# Patient Record
Sex: Female | Born: 1975 | Race: White | Hispanic: Refuse to answer | Marital: Single
Health system: Southern US, Academic
[De-identification: ages and names within clinical notes are randomized; demographics above are authoritative.]

## PROBLEM LIST (undated history)

## (undated) ENCOUNTER — Ambulatory Visit

## (undated) ENCOUNTER — Encounter

## (undated) DIAGNOSIS — N83202 Unspecified ovarian cyst, left side: Secondary | ICD-10-CM

## (undated) DIAGNOSIS — R351 Nocturia: Secondary | ICD-10-CM

## (undated) DIAGNOSIS — J302 Other seasonal allergic rhinitis: Secondary | ICD-10-CM

## (undated) DIAGNOSIS — R350 Frequency of micturition: Secondary | ICD-10-CM

## (undated) DIAGNOSIS — Z8709 Personal history of other diseases of the respiratory system: Secondary | ICD-10-CM

## (undated) DIAGNOSIS — N809 Endometriosis, unspecified: Secondary | ICD-10-CM

## (undated) DIAGNOSIS — K219 Gastro-esophageal reflux disease without esophagitis: Secondary | ICD-10-CM

## (undated) DIAGNOSIS — K589 Irritable bowel syndrome without diarrhea: Secondary | ICD-10-CM

## (undated) DIAGNOSIS — E559 Vitamin D deficiency, unspecified: Secondary | ICD-10-CM

## (undated) DIAGNOSIS — M62838 Other muscle spasm: Secondary | ICD-10-CM

## (undated) DIAGNOSIS — N83201 Unspecified ovarian cyst, right side: Secondary | ICD-10-CM

## (undated) DIAGNOSIS — G4700 Insomnia, unspecified: Secondary | ICD-10-CM

## (undated) DIAGNOSIS — G8929 Other chronic pain: Secondary | ICD-10-CM

## (undated) DIAGNOSIS — J45909 Unspecified asthma, uncomplicated: Secondary | ICD-10-CM

## (undated) DIAGNOSIS — I059 Rheumatic mitral valve disease, unspecified: Secondary | ICD-10-CM

## (undated) DIAGNOSIS — R011 Cardiac murmur, unspecified: Secondary | ICD-10-CM

## (undated) DIAGNOSIS — G43909 Migraine, unspecified, not intractable, without status migrainosus: Secondary | ICD-10-CM

## (undated) DIAGNOSIS — E785 Hyperlipidemia, unspecified: Secondary | ICD-10-CM

## (undated) DIAGNOSIS — E039 Hypothyroidism, unspecified: Secondary | ICD-10-CM

## (undated) DIAGNOSIS — N200 Calculus of kidney: Secondary | ICD-10-CM

## (undated) DIAGNOSIS — I639 Cerebral infarction, unspecified: Secondary | ICD-10-CM

## (undated) DIAGNOSIS — N189 Chronic kidney disease, unspecified: Secondary | ICD-10-CM

## (undated) DIAGNOSIS — F419 Anxiety disorder, unspecified: Secondary | ICD-10-CM

## (undated) DIAGNOSIS — M549 Dorsalgia, unspecified: Secondary | ICD-10-CM

## (undated) DIAGNOSIS — F32A Depression, unspecified: Secondary | ICD-10-CM

## (undated) DIAGNOSIS — F329 Major depressive disorder, single episode, unspecified: Secondary | ICD-10-CM

## (undated) HISTORY — DX: Major depressive disorder, single episode, unspecified: F32.9

## (undated) HISTORY — DX: Endometriosis, unspecified: N80.9

## (undated) HISTORY — PX: COLONOSCOPY: SHX174

## (undated) HISTORY — DX: Anxiety disorder, unspecified: F41.9

## (undated) HISTORY — DX: Chronic kidney disease, unspecified: N18.9

## (undated) HISTORY — DX: Hyperlipidemia, unspecified: E78.5

## (undated) HISTORY — DX: Depression, unspecified: F32.A

## (undated) HISTORY — DX: Migraine, unspecified, not intractable, without status migrainosus: G43.909

## (undated) HISTORY — DX: Cardiac murmur, unspecified: R01.1

## (undated) HISTORY — DX: Calculus of kidney: N20.0

## (undated) HISTORY — DX: Cerebral infarction, unspecified: I63.9

## (undated) HISTORY — DX: Rheumatic mitral valve disease, unspecified: I05.9

## (undated) HISTORY — PX: OTHER SURGICAL HISTORY: SHX169

## (undated) HISTORY — DX: Unspecified ovarian cyst, right side: N83.201

## (undated) HISTORY — PX: LUMBAR LAMINECTOMY: SHX95

## (undated) HISTORY — DX: Vitamin D deficiency, unspecified: E55.9

## (undated) HISTORY — DX: Unspecified ovarian cyst, left side: N83.202

---

## 2005-04-05 HISTORY — PX: LASIK: SHX215

## 2007-04-06 DIAGNOSIS — I639 Cerebral infarction, unspecified: Secondary | ICD-10-CM

## 2007-04-06 HISTORY — DX: Cerebral infarction, unspecified: I63.9

## 2013-09-26 ENCOUNTER — Institutional Professional Consult (permissible substitution): Payer: Self-pay | Admitting: Neurology

## 2013-10-02 ENCOUNTER — Ambulatory Visit (INDEPENDENT_AMBULATORY_CARE_PROVIDER_SITE_OTHER): Payer: PRIVATE HEALTH INSURANCE | Admitting: Neurology

## 2013-10-02 ENCOUNTER — Encounter: Payer: Self-pay | Admitting: Neurology

## 2013-10-02 ENCOUNTER — Encounter (INDEPENDENT_AMBULATORY_CARE_PROVIDER_SITE_OTHER): Payer: Self-pay

## 2013-10-02 VITALS — BP 112/80 | HR 71 | Temp 98.0°F | Ht 63.0 in | Wt 139.0 lb

## 2013-10-02 DIAGNOSIS — G479 Sleep disorder, unspecified: Secondary | ICD-10-CM | POA: Insufficient documentation

## 2013-10-02 DIAGNOSIS — R400 Somnolence: Secondary | ICD-10-CM

## 2013-10-02 DIAGNOSIS — R404 Transient alteration of awareness: Secondary | ICD-10-CM

## 2013-10-02 DIAGNOSIS — I6350 Cerebral infarction due to unspecified occlusion or stenosis of unspecified cerebral artery: Secondary | ICD-10-CM

## 2013-10-02 DIAGNOSIS — I639 Cerebral infarction, unspecified: Secondary | ICD-10-CM

## 2013-10-02 NOTE — Progress Notes (Signed)
Subjective:    Patient ID: Brenda Bonilla is a 38 y.o. female.  HPI    Huston FoleySaima Athar, MD, PhD [facility] [address], [address] [address] [place], [place] [address]  Dear Dr. Shary DecampGrisso and Efraim KaufmannMelissa,   I saw your patient, Brenda Bonilla, upon your kind request in my neurologic clinic today for initial consultation of her sleep disorder, in particular her daytime somnolence, concern for underlying sleep disordered breathing. The patient is unaccompanied today. As you know, Brenda Bonilla is a 38 year old right-handed woman with an underlying medical history of thyroid disease, asthma, hyperlipidemia, as well as stroke in 2009 (per patient, cerebellar, positive MRI, [facility], was taken off Relpax and OCP, saw neuro and cardio, now on ASA 325 mg), Irritable bowel syndrome, migraine headaches, reflux disease, and depression, who reports significant daytime somnolence. His ESS is 16/24 today. This has been going on for years and has been progressive. She has trouble "shutting down". She has been on Topamax since 2009. She snores, but only softly, no apneas reported, no gasping. She does have occasional AM HAs and has nocturia 1-2 x/night.  She drinks 1 cup of coffee in the AM, no sodas. She does not smoke and drinks EtOH occasionally, no illicit drugs. She lives alone. There is a TV in the bedroom and she puts it on a time. She does not have a sleep routine. She works as a Event organiserathletic trainer. She has no set bedtime, but it could be as early as 7:30 PM to 2 AM. Her rise time is between 6 AM to 10 AM. She feels poorly rested in the AM and has trouble going to sleep and more so staying asleep.  Her father is suspected to have OSA.  She naps when she has time and tends to sleep better during a nap. There is report of nighttime reflux, with no nighttime cough experienced. The patient has not noted any RLS symptoms and is not known to kick while asleep or before falling asleep.   She is a restless sleeper and in the morning, the bed is quite disheveled.   She denies cataplexy, sleep paralysis, hypnagogic or hypnopompic hallucinations, or sleep attacks. She does not report any vivid dreams, nightmares, dream enactments, or parasomnias, such as sleep talking or sleep walking. The patient has not had a sleep study or a home sleep test. She has no pets as she is allergic.   Her Past Medical History Is Significant For: Past Medical History  Diagnosis Date  . Stroke 2009  . Murmur, cardiac   . Mitral valve disorder   . Anxiety   . Depression   . Migraine   . Hyperlipidemia     Her Past Surgical History Is Significant For: Past Surgical History  Procedure Laterality Date  . Lasik  2007    Her Family History Is Significant For: Family History  Problem Relation Age of Onset  . Heart failure Father   . Stroke Father   . Stroke Paternal Grandmother   . Stroke Maternal Grandmother   . Hypertension Father   . Hyperlipidemia Father   . Hyperlipidemia Mother   . Diabetes Father   . Heart disease Mother     Her Social History Is Significant For: History   Social History  . Marital Status: Unknown    Spouse Name: N/A    Number of Children: N/A  . Years of Education: N/A   Social History Main Topics  . Smoking status:  Never Smoker   . Smokeless tobacco: None  . Alcohol Use: 2.5 oz/week    5 drink(s) per week  . Drug Use: No  . Sexual Activity: None   Other Topics Concern  . None   Social History Narrative  . None    Her Allergies Are:  No Known Allergies:   Her Current Medications Are:  Outpatient Encounter Prescriptions as of 10/02/2013  Medication Sig  . aspirin 325 MG tablet Take 325 mg by mouth daily.  Marland Kitchen. aspirin-acetaminophen-caffeine (EXCEDRIN MIGRAINE) 250-250-65 MG per tablet Take 1 tablet by mouth every 6 (six) hours as needed for headache.  . dicyclomine (BENTYL) 20 MG tablet Take 20 mg by mouth every 6 (six) hours.  Marland Kitchen. FLUoxetine  (PROZAC) 20 MG capsule Take 20 mg by mouth daily.  Marland Kitchen. levothyroxine (SYNTHROID, LEVOTHROID) 75 MCG tablet Take 75 mcg by mouth daily before breakfast.  . loratadine (CLARITIN) 10 MG tablet Take 10 mg by mouth daily.  . montelukast (SINGULAIR) 10 MG tablet Take 10 mg by mouth at bedtime.  Marland Kitchen. omeprazole (PRILOSEC) 20 MG capsule Take 20 mg by mouth daily.  . pravastatin (PRAVACHOL) 10 MG tablet Take 10 mg by mouth daily.  Marland Kitchen. topiramate (TOPAMAX) 100 MG tablet Take 100 mg by mouth 2 (two) times daily.  :  Review of Systems:  Out of a complete 14 point review of systems, all are reviewed and negative with the exception of these symptoms as listed below:   Review of Systems  Constitutional: Positive for activity change and fatigue.  Eyes: Negative.   Respiratory: Negative.   Cardiovascular:       Murmur  Gastrointestinal: Positive for diarrhea and blood in stool.  Endocrine: Positive for polydipsia.  Genitourinary: Positive for difficulty urinating.  Musculoskeletal: Negative.   Skin: Negative.   Allergic/Immunologic: Positive for environmental allergies.  Neurological: Positive for headaches.  Hematological: Negative.   Psychiatric/Behavioral: Positive for sleep disturbance (insomnia, eds) and dysphoric mood. The patient is nervous/anxious.     Objective:  Neurologic Exam  Physical Exam Physical Examination:   Filed Vitals:   10/02/13 0903  BP: 112/80  Pulse: 71  Temp: 98 F (36.7 C)    General Examination: The patient is a very pleasant 38 y.o. female in no acute distress. She appears well-developed and well-nourished and adequately groomed.   HEENT: Normocephalic, atraumatic, pupils are equal, round and reactive to light and accommodation. Funduscopic exam is normal with sharp disc margins noted. Extraocular tracking is good without limitation to gaze excursion or nystagmus noted. Normal smooth pursuit is noted. Hearing is grossly intact. Tympanic membranes are clear  bilaterally. Face is symmetric with normal facial animation and normal facial sensation. Speech is clear with no dysarthria noted. There is no hypophonia. There is no lip, neck/head, jaw or voice tremor. Neck is supple with full range of passive and active motion. There are no carotid bruits on auscultation. Oropharynx exam reveals: mild mouth dryness, good dental hygiene and moderate airway crowding, due to narrow airway entry and tonsils and slightly bigger tongue. Mallampati is class II. Tongue protrudes centrally and palate elevates symmetrically. Tonsils are 1+ to 2+ in size. Neck size is 13.5 inches. She has a tiny overbite. Nasal inspection reveals no significant nasal mucosal bogginess or redness and no septal deviation.   Chest: Clear to auscultation without wheezing, rhonchi or crackles noted.  Heart: S1+S2+0, regular and normal without murmurs, rubs or gallops noted.   Abdomen: Soft, non-tender and non-distended with normal bowel sounds  appreciated on auscultation.  Extremities: There is no pitting edema in the distal lower extremities bilaterally. Pedal pulses are intact.  Skin: Warm and dry without trophic changes noted. There are no varicose veins.  Musculoskeletal: exam reveals no obvious joint deformities, tenderness or joint swelling or erythema.   Neurologically:  Mental status: The patient is awake, alert and oriented in all 4 spheres. Her immediate and remote memory, attention, language skills and fund of knowledge are appropriate. There is no evidence of aphasia, agnosia, apraxia or anomia. Speech is clear with normal prosody and enunciation. Thought process is linear. Mood is normal and affect is normal.  Cranial nerves II - XII are as described above under HEENT exam. In addition: shoulder shrug is normal with equal shoulder height noted. Motor exam: Normal bulk, strength and tone is noted. There is no drift, tremor or rebound. Romberg is negative. Reflexes are 2+ throughout.  Babinski: Toes are flexor bilaterally. Fine motor skills and coordination: intact with normal finger taps, normal hand movements, normal rapid alternating patting, normal foot taps and normal foot agility.  Cerebellar testing: No dysmetria or intention tremor on finger to nose testing. Heel to shin is unremarkable bilaterally. There is no truncal or gait ataxia.  Sensory exam: intact to light touch, pinprick, vibration, temperature sense in the upper and lower extremities.  Gait, station and balance: She stands easily. No veering to one side is noted. No leaning to one side is noted. Posture is age-appropriate and stance is narrow based. Gait shows normal stride length and normal pace. No problems turning are noted. She turns en bloc. Tandem walk is unremarkable. Intact toe and heel stance is noted.               Assessment and Plan:   In summary, Brenda Bonilla is a very pleasant 38 y.o.-year old female with an underlying medical history of thyroid disease, asthma, hyperlipidemia, as well as cerebellar stroke (no residual signs noticible) in 2009, IBS, migraine headaches, reflux disease, and depression, who reports significant daytime somnolence with an ESS is 16/24 today. Her history and physical exam are not highly suspicious for sleep apnea as an underlying culprit of her daytime somnolence. She also does not have telltale symptoms of narcolepsy. Nevertheless, she may have a significant sleepiness disorder. She is on sedating medications but this may be a combination of medication effect, poor sleep hygiene, underlying anxiety/depression or mood disorder. She is trying to taper off of Prozac as I understand. She was on 40 mg and is currently on 20 mg. I am not sure as to the etiology of her sleepiness. I think it's worthwhile proceeding with further sleep evaluation in the form of a nocturnal polysomnogram followed by a nap study. In preparation of these studies she will have to come off of Prozac. She  has already discussed with you trying to taper off of it. She can go to once every other day for several days and then stop it. She is going to coordinate this with you. I talked to her at length about improving her sleep hygiene. Her physical exam is nonfocal and I reassured her in that regard. We talked about trying to maintain a healthy lifestyle in general, as well as the importance of weight control. I encouraged the patient to eat healthy, exercise daily and keep well hydrated, to keep a scheduled bedtime and wake time routine, to not skip any meals and eat healthy snacks in between meals. I advised the patient not to  drive when feeling sleepy. I recommended the following at this time: sleep study followed by MSLT. She is advised that she should be off of Prozac for 10-14 days prior to sleep testing.  I explained the sleep test procedure to the patient. We did talk about sleep apnea and its treatments and how it's linked to cardiovascular disease including stroke. Again, I think pretest probability for OSA is rather low in her case. She does seem have a positive family history for OSA and her father. She also has a mild to moderately tight airway. I answered all her questions today and the patient was in agreement. I would like to see her back after the sleep study is completed and encouraged her to call with any interim questions, concerns, problems or updates.   Thank you very much for allowing me to participate in the care of this nice patient. If I can be of any further assistance to you please do not hesitate to call me at [phone].  Sincerely,   Huston Foley, MD, PhD

## 2013-10-02 NOTE — Patient Instructions (Addendum)
  We will do a sleep study overnight and a subsequent nap study to further evaluate your severe sleepiness. However, in order to do these tests correctly, you will have to come off of the Prozac gradually. [facility] reducing your Prozac to 1/2 pill daily for some time and then stop. You should be off of the prozac 10-14 days prior to testing. We will schedule you accordingly. You can continue with all other meds.   Don't drive when sleepy.   Please remember to try to maintain good sleep hygiene, which means: Keep a regular sleep and wake schedule, try not to exercise or have a meal within 2 hours of your bedtime, try to keep your bedroom conducive for sleep, that is, cool and dark, without light distractors such as an illuminated alarm clock, and refrain from watching TV right before sleep or in the middle of the night and do not keep the TV or radio on during the night. Also, try not to use or play on electronic devices at bedtime, such as your cell phone, tablet PC or laptop. If you like to read at bedtime on an electronic device, try to dim the background light as much as possible. Do not eat in the middle of the night.

## 2013-10-08 ENCOUNTER — Telehealth: Payer: Self-pay | Admitting: Neurology

## 2013-10-08 NOTE — Telephone Encounter (Signed)
Please advise patient that she was referred for significant daytime somnolence for sleep evaluation and recommendation from my end of things was to proceed with sleep study testing. I do think that this is the next step in evaluation.

## 2013-10-08 NOTE — Telephone Encounter (Signed)
Pt calls requesting a call back from Dr. Frances FurbishAthar to discuss alternative treatment options regarding test.  Pt's estimated oop for the sleep study is $1650.00 due to her unmet deductible.  She would like medical advisement to see if there is another or alternative form of testing.  Advised the patient that notification of her request would be sent to the physician.

## 2013-11-25 ENCOUNTER — Ambulatory Visit (INDEPENDENT_AMBULATORY_CARE_PROVIDER_SITE_OTHER): Payer: PRIVATE HEALTH INSURANCE | Admitting: Neurology

## 2013-11-25 ENCOUNTER — Encounter: Payer: Self-pay | Admitting: Neurology

## 2013-11-25 DIAGNOSIS — G479 Sleep disorder, unspecified: Secondary | ICD-10-CM

## 2013-11-25 DIAGNOSIS — R0609 Other forms of dyspnea: Secondary | ICD-10-CM

## 2013-11-25 DIAGNOSIS — G4710 Hypersomnia, unspecified: Secondary | ICD-10-CM

## 2013-11-25 DIAGNOSIS — I639 Cerebral infarction, unspecified: Secondary | ICD-10-CM

## 2013-11-25 DIAGNOSIS — R0989 Other specified symptoms and signs involving the circulatory and respiratory systems: Secondary | ICD-10-CM

## 2013-11-25 DIAGNOSIS — R400 Somnolence: Secondary | ICD-10-CM

## 2013-11-26 DIAGNOSIS — G4710 Hypersomnia, unspecified: Secondary | ICD-10-CM

## 2013-12-07 ENCOUNTER — Telehealth: Payer: Self-pay | Admitting: Neurology

## 2013-12-07 NOTE — Telephone Encounter (Signed)
Please call the patient and advise her that her recent sleep studies did not show any significant obstructive sleep apnea but confirmed the fact that she is very sleepy during the day.Her daytime nap study showed findings consistent with an excessive degree of sleepiness but not telltale findings for narcolepsy. I would like to go for details of her study during a followup appointment. Please arrange for an appointment.

## 2013-12-11 NOTE — Telephone Encounter (Signed)
Per the patient's permission a general message was left on the patient's voicemail indicating that there was no indicator for the presence of sleep apnea.  It was also explained that there was no diagnosis of narcolepsy following the MSLT.  Patient was instructed to call office to set up follow up appointment.

## 2014-12-03 DIAGNOSIS — J4520 Mild intermittent asthma, uncomplicated: Secondary | ICD-10-CM | POA: Insufficient documentation

## 2014-12-03 DIAGNOSIS — J309 Allergic rhinitis, unspecified: Principal | ICD-10-CM

## 2014-12-03 DIAGNOSIS — H1010 Acute atopic conjunctivitis, unspecified eye: Secondary | ICD-10-CM | POA: Insufficient documentation

## 2014-12-03 DIAGNOSIS — Z8669 Personal history of other diseases of the nervous system and sense organs: Secondary | ICD-10-CM | POA: Insufficient documentation

## 2015-01-10 ENCOUNTER — Ambulatory Visit (INDEPENDENT_AMBULATORY_CARE_PROVIDER_SITE_OTHER): Payer: PRIVATE HEALTH INSURANCE | Admitting: *Deleted

## 2015-01-10 DIAGNOSIS — J309 Allergic rhinitis, unspecified: Secondary | ICD-10-CM | POA: Diagnosis not present

## 2015-01-24 ENCOUNTER — Ambulatory Visit (INDEPENDENT_AMBULATORY_CARE_PROVIDER_SITE_OTHER): Payer: PRIVATE HEALTH INSURANCE

## 2015-01-24 DIAGNOSIS — J309 Allergic rhinitis, unspecified: Secondary | ICD-10-CM | POA: Diagnosis not present

## 2015-02-07 ENCOUNTER — Ambulatory Visit (INDEPENDENT_AMBULATORY_CARE_PROVIDER_SITE_OTHER): Payer: PRIVATE HEALTH INSURANCE | Admitting: *Deleted

## 2015-02-07 DIAGNOSIS — J309 Allergic rhinitis, unspecified: Secondary | ICD-10-CM

## 2015-02-21 ENCOUNTER — Ambulatory Visit (INDEPENDENT_AMBULATORY_CARE_PROVIDER_SITE_OTHER): Payer: PRIVATE HEALTH INSURANCE | Admitting: *Deleted

## 2015-02-21 DIAGNOSIS — J309 Allergic rhinitis, unspecified: Secondary | ICD-10-CM

## 2015-03-10 ENCOUNTER — Ambulatory Visit (INDEPENDENT_AMBULATORY_CARE_PROVIDER_SITE_OTHER): Payer: PRIVATE HEALTH INSURANCE | Admitting: *Deleted

## 2015-03-10 DIAGNOSIS — J309 Allergic rhinitis, unspecified: Secondary | ICD-10-CM

## 2015-03-27 ENCOUNTER — Ambulatory Visit (INDEPENDENT_AMBULATORY_CARE_PROVIDER_SITE_OTHER): Payer: PRIVATE HEALTH INSURANCE | Admitting: *Deleted

## 2015-03-27 DIAGNOSIS — J309 Allergic rhinitis, unspecified: Secondary | ICD-10-CM | POA: Diagnosis not present

## 2015-04-11 ENCOUNTER — Ambulatory Visit (INDEPENDENT_AMBULATORY_CARE_PROVIDER_SITE_OTHER): Payer: PRIVATE HEALTH INSURANCE | Admitting: *Deleted

## 2015-04-11 DIAGNOSIS — J309 Allergic rhinitis, unspecified: Secondary | ICD-10-CM | POA: Diagnosis not present

## 2015-04-18 ENCOUNTER — Ambulatory Visit (INDEPENDENT_AMBULATORY_CARE_PROVIDER_SITE_OTHER): Payer: PRIVATE HEALTH INSURANCE | Admitting: *Deleted

## 2015-04-18 DIAGNOSIS — J309 Allergic rhinitis, unspecified: Secondary | ICD-10-CM

## 2015-04-25 ENCOUNTER — Ambulatory Visit (INDEPENDENT_AMBULATORY_CARE_PROVIDER_SITE_OTHER): Payer: PRIVATE HEALTH INSURANCE | Admitting: *Deleted

## 2015-04-25 DIAGNOSIS — J309 Allergic rhinitis, unspecified: Secondary | ICD-10-CM

## 2015-05-01 ENCOUNTER — Ambulatory Visit (INDEPENDENT_AMBULATORY_CARE_PROVIDER_SITE_OTHER): Payer: PRIVATE HEALTH INSURANCE | Admitting: *Deleted

## 2015-05-01 DIAGNOSIS — J309 Allergic rhinitis, unspecified: Secondary | ICD-10-CM

## 2015-05-15 ENCOUNTER — Ambulatory Visit (INDEPENDENT_AMBULATORY_CARE_PROVIDER_SITE_OTHER): Payer: PRIVATE HEALTH INSURANCE | Admitting: *Deleted

## 2015-05-15 DIAGNOSIS — J309 Allergic rhinitis, unspecified: Secondary | ICD-10-CM

## 2015-05-23 ENCOUNTER — Ambulatory Visit (INDEPENDENT_AMBULATORY_CARE_PROVIDER_SITE_OTHER): Payer: PRIVATE HEALTH INSURANCE

## 2015-05-23 DIAGNOSIS — J309 Allergic rhinitis, unspecified: Secondary | ICD-10-CM | POA: Diagnosis not present

## 2015-06-05 ENCOUNTER — Ambulatory Visit (INDEPENDENT_AMBULATORY_CARE_PROVIDER_SITE_OTHER): Payer: PRIVATE HEALTH INSURANCE | Admitting: *Deleted

## 2015-06-05 DIAGNOSIS — J309 Allergic rhinitis, unspecified: Secondary | ICD-10-CM

## 2015-06-20 ENCOUNTER — Ambulatory Visit (INDEPENDENT_AMBULATORY_CARE_PROVIDER_SITE_OTHER): Payer: PRIVATE HEALTH INSURANCE | Admitting: *Deleted

## 2015-06-20 DIAGNOSIS — J309 Allergic rhinitis, unspecified: Secondary | ICD-10-CM | POA: Diagnosis not present

## 2015-07-07 ENCOUNTER — Ambulatory Visit (INDEPENDENT_AMBULATORY_CARE_PROVIDER_SITE_OTHER): Payer: PRIVATE HEALTH INSURANCE | Admitting: *Deleted

## 2015-07-07 DIAGNOSIS — J309 Allergic rhinitis, unspecified: Secondary | ICD-10-CM

## 2015-07-17 ENCOUNTER — Ambulatory Visit (INDEPENDENT_AMBULATORY_CARE_PROVIDER_SITE_OTHER): Payer: PRIVATE HEALTH INSURANCE | Admitting: *Deleted

## 2015-07-17 DIAGNOSIS — J309 Allergic rhinitis, unspecified: Secondary | ICD-10-CM

## 2015-07-21 DIAGNOSIS — J301 Allergic rhinitis due to pollen: Secondary | ICD-10-CM | POA: Diagnosis not present

## 2015-07-22 DIAGNOSIS — J3089 Other allergic rhinitis: Secondary | ICD-10-CM | POA: Diagnosis not present

## 2015-07-25 ENCOUNTER — Ambulatory Visit (INDEPENDENT_AMBULATORY_CARE_PROVIDER_SITE_OTHER): Payer: PRIVATE HEALTH INSURANCE | Admitting: *Deleted

## 2015-07-25 DIAGNOSIS — J309 Allergic rhinitis, unspecified: Secondary | ICD-10-CM

## 2015-08-04 ENCOUNTER — Ambulatory Visit (INDEPENDENT_AMBULATORY_CARE_PROVIDER_SITE_OTHER): Payer: PRIVATE HEALTH INSURANCE

## 2015-08-04 DIAGNOSIS — J309 Allergic rhinitis, unspecified: Secondary | ICD-10-CM | POA: Diagnosis not present

## 2015-08-05 ENCOUNTER — Other Ambulatory Visit: Payer: Self-pay | Admitting: Orthopedic Surgery

## 2015-08-12 NOTE — Pre-Procedure Instructions (Signed)
Brenda SkatesGarrie Renken  08/12/2015      [id number] [place], [place] - [address] [place] [address] Phone: [phone] Fax: [phone]    Your procedure is scheduled on Wed, May 17 @ 12:50 PM  [facility] at 9:45 AM  Call this number if you have problems the morning of surgery:  [phone]   Remember:  Do not eat food or drink liquids after midnight.  Take these medicines the morning of surgery with A SIP OF WATER Albuterol<Bring Your Inhaler With You>,Zyrtec(Cetirizine),Bentyl(Dicyclomine),Synthroid(Levothyroxine),Eye Drops,Singulair(Montelukast),and Omeprazole(Prilosec)              Stop taking your Aspirin. No Goody's,BC's,Aleve,Advil,Motrin,Ibuprofen,Fish Oil,or any Herbal Medications.    Do not wear jewelry, make-up or nail polish.  Do not wear lotions, powders, or perfumes.   Do not shave 48 hours prior to surgery.    Do not bring valuables to the hospital.  [facility] is not responsible for any belongings or valuables.  Contacts, dentures or bridgework may not be worn into surgery.  Leave your suitcase in the car.  After surgery it may be brought to your room.  For patients admitted to the hospital, discharge time will be determined by your treatment team.  Patients discharged the day of surgery will not be allowed to drive home.    Special instructions:  [place] - Preparing for Surgery  Before surgery, you can play an important role.  Because skin is not sterile, your skin needs to be as free of germs as possible.  You can reduce the number of germs on you skin by washing with CHG (chlorahexidine gluconate) soap before surgery.  CHG is an antiseptic cleaner which kills germs and bonds with the skin to continue killing germs even after washing.  Please DO NOT use if you have an allergy to CHG or antibacterial soaps.  If your skin becomes reddened/irritated stop using the CHG and inform your nurse  when you arrive at Short Stay.  Do not shave (including legs and underarms) for at least 48 hours prior to the first CHG shower.  You may shave your face.  Please follow these instructions carefully:   1.  Shower with CHG Soap the night before surgery and the                                morning of Surgery.  2.  If you choose to wash your hair, wash your hair first as usual with your       normal shampoo.  3.  After you shampoo, rinse your hair and body thoroughly to remove the                      Shampoo.  4.  Use CHG as you would any other liquid soap.  You can apply chg directly       to the skin and wash gently with scrungie or a clean washcloth.  5.  Apply the CHG Soap to your body ONLY FROM THE NECK DOWN.        Do not use on open wounds or open sores.  Avoid contact with your eyes,       ears, mouth and genitals (private parts).  Wash genitals (private parts)       with your normal soap.  6.  Wash thoroughly, paying special attention  to the area where your surgery        will be performed.  7.  Thoroughly rinse your body with warm water from the neck down.  8.  DO NOT shower/wash with your normal soap after using and rinsing off       the CHG Soap.  9.  Pat yourself dry with a clean towel.            10.  Wear clean pajamas.            11.  Place clean sheets on your bed the night of your first shower and do not        sleep with pets.  Day of Surgery  Do not apply any lotions/deoderants the morning of surgery.  Please wear clean clothes to the hospital/surgery center.    Please read over the following fact sheets that you were given. Pain Booklet, Coughing and Deep Breathing, Blood Transfusion Information, MRSA Information and Surgical Site Infection Prevention

## 2015-08-13 ENCOUNTER — Encounter (HOSPITAL_COMMUNITY)
Admission: RE | Admit: 2015-08-13 | Discharge: 2015-08-13 | Disposition: A | Discharge status: Home / Self Care | Payer: PRIVATE HEALTH INSURANCE | Source: Ambulatory Visit | Attending: Orthopedic Surgery | Admitting: Orthopedic Surgery

## 2015-08-13 ENCOUNTER — Encounter (HOSPITAL_COMMUNITY): Payer: Self-pay

## 2015-08-13 DIAGNOSIS — Z0183 Encounter for blood typing: Secondary | ICD-10-CM | POA: Diagnosis not present

## 2015-08-13 DIAGNOSIS — Z01818 Encounter for other preprocedural examination: Secondary | ICD-10-CM | POA: Diagnosis not present

## 2015-08-13 DIAGNOSIS — M5410 Radiculopathy, site unspecified: Secondary | ICD-10-CM | POA: Insufficient documentation

## 2015-08-13 DIAGNOSIS — R9431 Abnormal electrocardiogram [ECG] [EKG]: Secondary | ICD-10-CM | POA: Diagnosis not present

## 2015-08-13 DIAGNOSIS — Z01812 Encounter for preprocedural laboratory examination: Secondary | ICD-10-CM | POA: Insufficient documentation

## 2015-08-13 HISTORY — DX: Unspecified asthma, uncomplicated: J45.909

## 2015-08-13 HISTORY — DX: Irritable bowel syndrome, unspecified: K58.9

## 2015-08-13 HISTORY — DX: Other seasonal allergic rhinitis: J30.2

## 2015-08-13 HISTORY — DX: Other muscle spasm: M62.838

## 2015-08-13 HISTORY — DX: Hypothyroidism, unspecified: E03.9

## 2015-08-13 HISTORY — DX: Gastro-esophageal reflux disease without esophagitis: K21.9

## 2015-08-13 HISTORY — DX: Insomnia, unspecified: G47.00

## 2015-08-13 HISTORY — DX: Nocturia: R35.1

## 2015-08-13 HISTORY — DX: Other chronic pain: G89.29

## 2015-08-13 HISTORY — DX: Frequency of micturition: R35.0

## 2015-08-13 HISTORY — DX: Personal history of other diseases of the respiratory system: Z87.09

## 2015-08-13 HISTORY — DX: Dorsalgia, unspecified: M54.9

## 2015-08-13 LAB — COMPREHENSIVE METABOLIC PANEL
ALBUMIN: 4.3 g/dL (ref 3.5–5.0)
ALK PHOS: 48 U/L (ref 38–126)
ALT: 18 U/L (ref 14–54)
ANION GAP: 9 (ref 5–15)
AST: 19 U/L (ref 15–41)
BILIRUBIN TOTAL: 0.6 mg/dL (ref 0.3–1.2)
BUN: 21 mg/dL — ABNORMAL HIGH (ref 6–20)
CALCIUM: 9.6 mg/dL (ref 8.9–10.3)
CO2: 20 mmol/L — AB (ref 22–32)
CREATININE: 0.86 mg/dL (ref 0.44–1.00)
Chloride: 110 mmol/L (ref 101–111)
GFR calc non Af Amer: >60 mL/min (ref >60)
GLUCOSE: 101 mg/dL — AB (ref 65–99)
Potassium: 3.5 mmol/L (ref 3.5–5.1)
SODIUM: 139 mmol/L (ref 135–145)
TOTAL PROTEIN: 7.7 g/dL (ref 6.5–8.1)

## 2015-08-13 LAB — URINALYSIS, ROUTINE W REFLEX MICROSCOPIC
Bilirubin Urine: NEGATIVE
Glucose, UA: NEGATIVE mg/dL
Hgb urine dipstick: NEGATIVE
Ketones, ur: NEGATIVE mg/dL
Nitrite: NEGATIVE
PROTEIN: NEGATIVE mg/dL
SPECIFIC GRAVITY, URINE: 1.023 (ref 1.005–1.030)
pH: 5.5 (ref 5.0–8.0)

## 2015-08-13 LAB — CBC WITH DIFFERENTIAL/PLATELET
Basophils Absolute: 0 10*3/uL (ref 0.0–0.1)
Basophils Relative: 1 %
EOS ABS: 0.2 10*3/uL (ref 0.0–0.7)
Eosinophils Relative: 3 %
HEMATOCRIT: 39.7 % (ref 36.0–46.0)
HEMOGLOBIN: 13.5 g/dL (ref 12.0–15.0)
LYMPHS ABS: 2.3 10*3/uL (ref 0.7–4.0)
LYMPHS PCT: 37 %
MCH: 31.1 pg (ref 26.0–34.0)
MCHC: 34 g/dL (ref 30.0–36.0)
MCV: 91.5 fL (ref 78.0–100.0)
MONOS PCT: 6 %
Monocytes Absolute: 0.4 10*3/uL (ref 0.1–1.0)
NEUTROS ABS: 3.4 10*3/uL (ref 1.7–7.7)
NEUTROS PCT: 53 %
Platelets: 255 10*3/uL (ref 150–400)
RBC: 4.34 MIL/uL (ref 3.87–5.11)
RDW: 12.7 % (ref 11.5–15.5)
WBC: 6.3 10*3/uL (ref 4.0–10.5)

## 2015-08-13 LAB — URINE MICROSCOPIC-ADD ON: RBC / HPF: NONE SEEN RBC/hpf (ref 0–5)

## 2015-08-13 LAB — ABO/RH: ABO/RH(D): A NEG

## 2015-08-13 LAB — PROTIME-INR
INR: 1.02 (ref 0.00–1.49)
Prothrombin Time: 13.6 seconds (ref 11.6–15.2)

## 2015-08-13 LAB — SURGICAL PCR SCREEN
MRSA, PCR: NEGATIVE
Staphylococcus aureus: POSITIVE — AB

## 2015-08-13 LAB — HCG, SERUM, QUALITATIVE: PREG SERUM: NEGATIVE

## 2015-08-13 LAB — TYPE AND SCREEN
ABO/RH(D): A NEG
Antibody Screen: NEGATIVE

## 2015-08-13 LAB — APTT: aPTT: 24 seconds (ref 24–37)

## 2015-08-13 MED ORDER — POVIDONE-IODINE 7.5 % EX SOLN: Freq: Once | CUTANEOUS | Status: DC

## 2015-08-13 NOTE — Progress Notes (Addendum)
Cardiologist denies  Medical Md is Dr.Joyce [place] Pat[place]  Echo done in 2009  Stress test had one in high school  Heart cath denies  EKG denies in past yr  CXR denies in past yr  Sleep study in epic from 2015-no sleep apnea

## 2015-08-13 NOTE — Progress Notes (Signed)
[facility] called into [facility] [address] in [place]

## 2015-08-15 ENCOUNTER — Ambulatory Visit (INDEPENDENT_AMBULATORY_CARE_PROVIDER_SITE_OTHER): Payer: PRIVATE HEALTH INSURANCE

## 2015-08-15 DIAGNOSIS — J309 Allergic rhinitis, unspecified: Secondary | ICD-10-CM

## 2015-08-18 ENCOUNTER — Ambulatory Visit (INDEPENDENT_AMBULATORY_CARE_PROVIDER_SITE_OTHER): Payer: PRIVATE HEALTH INSURANCE | Admitting: *Deleted

## 2015-08-18 DIAGNOSIS — J309 Allergic rhinitis, unspecified: Secondary | ICD-10-CM

## 2015-08-18 NOTE — H&P (Signed)
PREOPERATIVE H&P  Chief Complaint: L > R leg pain  HPI: Brenda Bonilla is a 40 y.o. female who presents with ongoing pain in the bilateral legs, L greater than R. Pain has now been present for 10 months. Pain continues to be severe.   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 Xrays reveal a grade 1 spondylolisthesis.   Patient has failed multiple forms of conservative care and continues to have pain (see office notes for additional details regarding the patient's full course of treatment)  Past Medical History  Diagnosis Date  . Murmur, cardiac   . Mitral valve disorder   . Anxiety   . Hyperlipidemia     takes Pravastatin daily   . GERD (gastroesophageal reflux disease)     takes Omeprazole daily  . Hypothyroidism     takes SYnthroid daily  . Muscle spasm   . Stroke [facility]) 2009    takes ASA 325 mg daily  . Asthma     Albuterol inhaler as needed  . Seasonal allergies     takes Zyrtec and Singulair daily.Also gets allergy shots  . History of bronchitis     several yrs ago  . Migraine     takes Topamax daily.HA regularly but has been a long time since migraine  . Chronic back pain     left sided radiculopathy.Scoliosis.Buldging disc.  . IBS (irritable bowel syndrome)     takes Bentyl daily  . Urinary frequency   . Nocturia   . Depression     has Fluoxetine if needed  . Insomnia    Past Surgical History  Procedure Laterality Date  . Lasik  2007  . Wisdom teeth extracted     . Colonoscopy     Social History   Social History  . Marital Status: Single    Spouse Name: N/A  . Number of Children: N/A  . Years of Education: N/A   Social History Main Topics  . Smoking status: Never Smoker   . Smokeless tobacco: Not on file  . Alcohol Use: 2.5 oz/week    5 Standard drinks or equivalent per week     Comment: occasionally  . Drug Use: No  . Sexual Activity: Yes   Other Topics Concern  . Not on  file   Social History Narrative   Family History  Problem Relation Age of Onset  . Heart failure Father   . Stroke Father   . Stroke Paternal Grandmother   . Stroke Maternal Grandmother   . Hypertension Father   . Hyperlipidemia Father   . Hyperlipidemia Mother   . Diabetes Father   . Heart disease Mother    No Known Allergies Prior to Admission medications   Medication Sig Start Date End Date Taking? Authorizing Provider  albuterol (PROVENTIL HFA;VENTOLIN HFA) 108 (90 Base) MCG/ACT inhaler Inhale 2 puffs into the lungs daily as needed for wheezing or shortness of breath.   Yes Historical Provider, MD  aspirin 325 MG tablet Take 325 mg by mouth daily.   Yes Historical Provider, MD  aspirin-acetaminophen-caffeine (EXCEDRIN MIGRAINE) [phone] MG per tablet Take 2 tablets by mouth daily as needed for migraine.    Yes Historical Provider, MD  cetirizine (ZYRTEC) 10 MG tablet Take 10 mg by mouth daily.   Yes Historical Provider, MD  Cholecalciferol (VITAMIN D) 2000 units CAPS Take 2,000 Units by mouth daily.   Yes Historical Provider, MD  cyclobenzaprine (FLEXERIL) 5 MG tablet Take 5 mg by mouth at bedtime as needed for muscle spasms.  07/19/15  Yes Historical Provider, MD  dicyclomine (BENTYL) 20 MG tablet Take 20 mg by mouth daily as needed for spasms.    Yes Historical Provider, MD  ketotifen (ZADITOR) 0.025 % ophthalmic solution Place 1 drop into both eyes daily as needed (for dry eyes).   Yes Historical Provider, MD  levothyroxine (SYNTHROID, LEVOTHROID) 88 MCG tablet Take 88 mcg by mouth daily before breakfast.  07/24/15  Yes Historical Provider, MD  methocarbamol (ROBAXIN) 500 MG tablet Take 500 mg by mouth every 8 (eight) hours as needed for muscle spasms.  07/19/15  Yes Historical Provider, MD  Miscellaneous Vaginal Products [facility](REPHRESH CLEAN BALANCE VA) Place 1 tablet vaginally daily.   Yes Historical Provider, MD  montelukast (SINGULAIR) 10 MG tablet Take 10 mg by mouth daily.    Yes  Historical Provider, MD  Olopatadine HCl (PAZEO OP) Apply 1 drop to eye daily.   Yes Historical Provider, MD  omeprazole (PRILOSEC) 20 MG capsule Take 20 mg by mouth daily as needed.    Yes Historical Provider, MD  pravastatin (PRAVACHOL) 10 MG tablet Take 20 mg by mouth daily.    Yes Historical Provider, MD  topiramate (TOPAMAX) 100 MG tablet Take 100 mg by mouth 2 (two) times daily.   Yes Historical Provider, MD     All other systems have been reviewed and were otherwise negative with the exception of those mentioned in the HPI and as above.  Physical Exam: There were no vitals filed for this visit.  General: Alert, no acute distress Cardiovascular: No pedal edema Respiratory: No cyanosis, no use of accessory musculature Skin: No lesions in the area of chief complaint Neurologic: Sensation intact distally Psychiatric: Patient is competent for consent with normal mood and affect Lymphatic: No axillary or cervical lymphadenopathy  MUSCULOSKELETAL: + SLR on the left. Patient lacks an achilles reflex on the left.  Assessment/Plan: Left sided radiculopathy, L5/S1 spondylolisthesis  Plan for Procedure(s): LEFT SIDED LUMBAR 5-SACRUM 1 TRANSFORAMINAL LUMAR INTERBODY FUSION WITH INSTRUMENTATION AND ALLOGRAFT   Emilee HeroUMONSKI,Meridith Romick LEONARD, MD 08/18/2015 8:25 AM

## 2015-08-20 ENCOUNTER — Inpatient Hospital Stay (HOSPITAL_COMMUNITY)
Admission: RE | Admit: 2015-08-20 | Discharge: 2015-08-21 | DRG: 460 | Disposition: A | Discharge status: Home / Self Care | Payer: PRIVATE HEALTH INSURANCE | Source: Ambulatory Visit | Attending: Orthopedic Surgery | Admitting: Orthopedic Surgery

## 2015-08-20 ENCOUNTER — Inpatient Hospital Stay (HOSPITAL_COMMUNITY): Payer: PRIVATE HEALTH INSURANCE

## 2015-08-20 ENCOUNTER — Inpatient Hospital Stay (HOSPITAL_COMMUNITY): Payer: PRIVATE HEALTH INSURANCE | Admitting: Certified Registered"

## 2015-08-20 ENCOUNTER — Encounter (HOSPITAL_COMMUNITY)
Admission: RE | Disposition: A | Discharge status: Home / Self Care | Payer: Self-pay | Source: Ambulatory Visit | Attending: Orthopedic Surgery

## 2015-08-20 ENCOUNTER — Encounter (HOSPITAL_COMMUNITY): Payer: Self-pay | Admitting: *Deleted

## 2015-08-20 DIAGNOSIS — M79605 Pain in left leg: Secondary | ICD-10-CM | POA: Diagnosis present

## 2015-08-20 DIAGNOSIS — K589 Irritable bowel syndrome without diarrhea: Secondary | ICD-10-CM | POA: Diagnosis present

## 2015-08-20 DIAGNOSIS — J45909 Unspecified asthma, uncomplicated: Secondary | ICD-10-CM | POA: Diagnosis present

## 2015-08-20 DIAGNOSIS — Z8673 Personal history of transient ischemic attack (TIA), and cerebral infarction without residual deficits: Secondary | ICD-10-CM | POA: Diagnosis not present

## 2015-08-20 DIAGNOSIS — K219 Gastro-esophageal reflux disease without esophagitis: Secondary | ICD-10-CM | POA: Diagnosis present

## 2015-08-20 DIAGNOSIS — M5410 Radiculopathy, site unspecified: Secondary | ICD-10-CM | POA: Diagnosis present

## 2015-08-20 DIAGNOSIS — M5127 Other intervertebral disc displacement, lumbosacral region: Secondary | ICD-10-CM | POA: Diagnosis present

## 2015-08-20 DIAGNOSIS — M4317 Spondylolisthesis, lumbosacral region: Secondary | ICD-10-CM | POA: Diagnosis present

## 2015-08-20 DIAGNOSIS — E039 Hypothyroidism, unspecified: Secondary | ICD-10-CM | POA: Diagnosis present

## 2015-08-20 DIAGNOSIS — E785 Hyperlipidemia, unspecified: Secondary | ICD-10-CM | POA: Diagnosis present

## 2015-08-20 DIAGNOSIS — G4700 Insomnia, unspecified: Secondary | ICD-10-CM | POA: Diagnosis present

## 2015-08-20 DIAGNOSIS — M5417 Radiculopathy, lumbosacral region: Secondary | ICD-10-CM | POA: Diagnosis present

## 2015-08-20 DIAGNOSIS — Z419 Encounter for procedure for purposes other than remedying health state, unspecified: Secondary | ICD-10-CM

## 2015-08-20 SURGERY — POSTERIOR LUMBAR FUSION 1 LEVEL
Anesthesia: General | Laterality: Left

## 2015-08-20 MED ORDER — MENTHOL 3 MG MT LOZG: 1.0000 | LOZENGE | OROMUCOSAL | Status: DC | PRN

## 2015-08-20 MED ORDER — SUCCINYLCHOLINE CHLORIDE 20 MG/ML IJ SOLN
INTRAMUSCULAR | Status: DC | PRN
  Administered 2015-08-20: 120 mg via INTRAVENOUS

## 2015-08-20 MED ORDER — ZOLPIDEM TARTRATE 5 MG PO TABS: 5.0000 mg | ORAL_TABLET | Freq: Every evening | ORAL | Status: DC | PRN

## 2015-08-20 MED ORDER — THROMBIN 20000 UNITS EX SOLR
CUTANEOUS | Status: AC
  Filled 2015-08-20: qty 20000

## 2015-08-20 MED ORDER — CEFAZOLIN SODIUM-DEXTROSE 2-4 GM/100ML-% IV SOLN
2.0000 g | INTRAVENOUS | Status: AC
  Administered 2015-08-20 (×2): 2 g via INTRAVENOUS
  Filled 2015-08-20: qty 100

## 2015-08-20 MED ORDER — PHENOL 1.4 % MT LIQD: 1.0000 | OROMUCOSAL | Status: DC | PRN

## 2015-08-20 MED ORDER — ONDANSETRON HCL 4 MG/2ML IJ SOLN
4.0000 mg | Freq: Once | INTRAMUSCULAR | Status: DC | PRN
Start: 2015-08-20 — End: 2015-08-20

## 2015-08-20 MED ORDER — GELATIN ABSORBABLE 100 EX MISC
CUTANEOUS | Status: DC | PRN
  Administered 2015-08-20: 20 mL via TOPICAL

## 2015-08-20 MED ORDER — GLYCOPYRROLATE 0.2 MG/ML IJ SOLN
INTRAMUSCULAR | Status: DC | PRN
  Administered 2015-08-20: 0.4 mg via INTRAVENOUS

## 2015-08-20 MED ORDER — FENTANYL CITRATE (PF) 100 MCG/2ML IJ SOLN
25.0000 ug | INTRAMUSCULAR | Status: DC | PRN
  Administered 2015-08-20 (×2): 50 ug via INTRAVENOUS

## 2015-08-20 MED ORDER — OXYCODONE HCL 5 MG/5ML PO SOLN: 5.0000 mg | Freq: Once | ORAL | Status: AC | PRN

## 2015-08-20 MED ORDER — MIDAZOLAM HCL 5 MG/5ML IJ SOLN
INTRAMUSCULAR | Status: DC | PRN
  Administered 2015-08-20: 2 mg via INTRAVENOUS

## 2015-08-20 MED ORDER — FENTANYL CITRATE (PF) 250 MCG/5ML IJ SOLN
INTRAMUSCULAR | Status: AC
  Filled 2015-08-20: qty 5

## 2015-08-20 MED ORDER — ONDANSETRON HCL 4 MG/2ML IJ SOLN: 4.0000 mg | INTRAMUSCULAR | Status: DC | PRN

## 2015-08-20 MED ORDER — MONTELUKAST SODIUM 10 MG PO TABS
10.0000 mg | ORAL_TABLET | Freq: Every day | ORAL | Status: DC
Start: 2015-08-21 — End: 2015-08-21
  Filled 2015-08-20: qty 1

## 2015-08-20 MED ORDER — ROCURONIUM BROMIDE 50 MG/5ML IV SOLN
INTRAVENOUS | Status: AC
  Filled 2015-08-20: qty 1

## 2015-08-20 MED ORDER — DIAZEPAM 5 MG PO TABS
ORAL_TABLET | ORAL | Status: AC
  Filled 2015-08-20: qty 1

## 2015-08-20 MED ORDER — PRAVASTATIN SODIUM 40 MG PO TABS
20.0000 mg | ORAL_TABLET | Freq: Every day | ORAL | Status: DC
  Administered 2015-08-20: 20 mg via ORAL
  Filled 2015-08-20: qty 1

## 2015-08-20 MED ORDER — PHENYLEPHRINE 40 MCG/ML (10ML) SYRINGE FOR IV PUSH (FOR BLOOD PRESSURE SUPPORT)
PREFILLED_SYRINGE | INTRAVENOUS | Status: DC | PRN
  Administered 2015-08-20: 40 ug via INTRAVENOUS
  Administered 2015-08-20: 80 ug via INTRAVENOUS
  Administered 2015-08-20 (×2): 40 ug via INTRAVENOUS

## 2015-08-20 MED ORDER — DOCUSATE SODIUM 100 MG PO CAPS
100.0000 mg | ORAL_CAPSULE | Freq: Two times a day (BID) | ORAL | Status: DC
  Administered 2015-08-20: 100 mg via ORAL
  Filled 2015-08-20: qty 1

## 2015-08-20 MED ORDER — METHYLENE BLUE 0.5 % INJ SOLN
INTRAVENOUS | Status: AC
  Filled 2015-08-20: qty 10

## 2015-08-20 MED ORDER — FLEET ENEMA 7-19 GM/118ML RE ENEM: 1.0000 | ENEMA | Freq: Once | RECTAL | Status: DC | PRN

## 2015-08-20 MED ORDER — FENTANYL CITRATE (PF) 100 MCG/2ML IJ SOLN
INTRAMUSCULAR | Status: DC | PRN
  Administered 2015-08-20: 25 ug via INTRAVENOUS
  Administered 2015-08-20: 100 ug via INTRAVENOUS
  Administered 2015-08-20: 150 ug via INTRAVENOUS
  Administered 2015-08-20: 50 ug via INTRAVENOUS
  Administered 2015-08-20 (×2): 25 ug via INTRAVENOUS
  Administered 2015-08-20: 50 ug via INTRAVENOUS

## 2015-08-20 MED ORDER — MIDAZOLAM HCL 2 MG/2ML IJ SOLN
INTRAMUSCULAR | Status: AC
  Filled 2015-08-20: qty 2

## 2015-08-20 MED ORDER — PROPOFOL 10 MG/ML IV BOLUS
INTRAVENOUS | Status: AC
  Filled 2015-08-20: qty 20

## 2015-08-20 MED ORDER — BUPIVACAINE-EPINEPHRINE (PF) 0.25% -1:200000 IJ SOLN
INTRAMUSCULAR | Status: AC
  Filled 2015-08-20: qty 30

## 2015-08-20 MED ORDER — TOPIRAMATE 100 MG PO TABS
100.0000 mg | ORAL_TABLET | Freq: Two times a day (BID) | ORAL | Status: DC
  Administered 2015-08-20: 100 mg via ORAL
  Filled 2015-08-20 (×2): qty 1

## 2015-08-20 MED ORDER — NEOSTIGMINE METHYLSULFATE 10 MG/10ML IV SOLN
INTRAVENOUS | Status: DC | PRN
  Administered 2015-08-20: 3 mg via INTRAVENOUS

## 2015-08-20 MED ORDER — HYDROCODONE-ACETAMINOPHEN 5-325 MG PO TABS: 1.0000 | ORAL_TABLET | ORAL | Status: DC | PRN

## 2015-08-20 MED ORDER — SENNOSIDES-DOCUSATE SODIUM 8.6-50 MG PO TABS: 1.0000 | ORAL_TABLET | Freq: Every evening | ORAL | Status: DC | PRN

## 2015-08-20 MED ORDER — VITAMIN D 1000 UNITS PO TABS: 2000.0000 [IU] | ORAL_TABLET | Freq: Every day | ORAL | Status: DC

## 2015-08-20 MED ORDER — SODIUM CHLORIDE 0.9% FLUSH: 3.0000 mL | INTRAVENOUS | Status: DC | PRN

## 2015-08-20 MED ORDER — ACETAMINOPHEN 325 MG PO TABS: 650.0000 mg | ORAL_TABLET | ORAL | Status: DC | PRN

## 2015-08-20 MED ORDER — BISACODYL 5 MG PO TBEC: 5.0000 mg | DELAYED_RELEASE_TABLET | Freq: Every day | ORAL | Status: DC | PRN

## 2015-08-20 MED ORDER — DIAZEPAM 5 MG PO TABS
5.0000 mg | ORAL_TABLET | Freq: Four times a day (QID) | ORAL | Status: DC | PRN
  Administered 2015-08-20 – 2015-08-21 (×2): 5 mg via ORAL
  Filled 2015-08-20: qty 1

## 2015-08-20 MED ORDER — THROMBIN 20000 UNITS EX KIT: PACK | CUTANEOUS | Status: DC | PRN

## 2015-08-20 MED ORDER — OXYCODONE HCL 5 MG PO TABS
ORAL_TABLET | ORAL | Status: AC
  Filled 2015-08-20: qty 1

## 2015-08-20 MED ORDER — PANTOPRAZOLE SODIUM 40 MG PO TBEC: 40.0000 mg | DELAYED_RELEASE_TABLET | Freq: Every day | ORAL | Status: DC

## 2015-08-20 MED ORDER — 0.9 % SODIUM CHLORIDE (POUR BTL) OPTIME
TOPICAL | Status: DC | PRN
  Administered 2015-08-20: 1000 mL

## 2015-08-20 MED ORDER — FENTANYL CITRATE (PF) 100 MCG/2ML IJ SOLN
INTRAMUSCULAR | Status: AC
  Filled 2015-08-20: qty 2

## 2015-08-20 MED ORDER — PROPOFOL 500 MG/50ML IV EMUL
INTRAVENOUS | Status: DC | PRN
  Administered 2015-08-20: 50 ug/kg/min via INTRAVENOUS

## 2015-08-20 MED ORDER — ALUM & MAG HYDROXIDE-SIMETH 200-200-20 MG/5ML PO SUSP: 30.0000 mL | Freq: Four times a day (QID) | ORAL | Status: DC | PRN

## 2015-08-20 MED ORDER — OXYCODONE-ACETAMINOPHEN 5-325 MG PO TABS
1.0000 | ORAL_TABLET | ORAL | Status: DC | PRN
  Administered 2015-08-20: 2 via ORAL
  Administered 2015-08-21: 1 via ORAL
  Administered 2015-08-21 (×2): 2 via ORAL
  Filled 2015-08-20: qty 2
  Filled 2015-08-20 (×2): qty 1
  Filled 2015-08-20: qty 2
  Filled 2015-08-20: qty 1

## 2015-08-20 MED ORDER — KETOTIFEN FUMARATE 0.025 % OP SOLN
1.0000 [drp] | Freq: Every day | OPHTHALMIC | Status: DC | PRN
  Filled 2015-08-20: qty 5

## 2015-08-20 MED ORDER — LACTATED RINGERS IV SOLN
INTRAVENOUS | Status: DC
  Administered 2015-08-20 (×4): via INTRAVENOUS

## 2015-08-20 MED ORDER — CEFAZOLIN SODIUM 1-5 GM-% IV SOLN
1.0000 g | Freq: Three times a day (TID) | INTRAVENOUS | Status: AC
  Administered 2015-08-20: 1 g via INTRAVENOUS
  Filled 2015-08-20: qty 50

## 2015-08-20 MED ORDER — OLOPATADINE HCL 0.7 % OP SOLN: Freq: Every day | OPHTHALMIC | Status: DC

## 2015-08-20 MED ORDER — LORATADINE 10 MG PO TABS: 10.0000 mg | ORAL_TABLET | Freq: Every day | ORAL | Status: DC

## 2015-08-20 MED ORDER — ROCURONIUM BROMIDE 100 MG/10ML IV SOLN
INTRAVENOUS | Status: DC | PRN
  Administered 2015-08-20: 10 mg via INTRAVENOUS
  Administered 2015-08-20: 40 mg via INTRAVENOUS

## 2015-08-20 MED ORDER — DICYCLOMINE HCL 20 MG PO TABS
20.0000 mg | ORAL_TABLET | Freq: Every day | ORAL | Status: DC | PRN
  Filled 2015-08-20: qty 1

## 2015-08-20 MED ORDER — MORPHINE SULFATE (PF) 2 MG/ML IV SOLN: 2.0000 mg | INTRAVENOUS | Status: DC | PRN

## 2015-08-20 MED ORDER — LIDOCAINE HCL (CARDIAC) 20 MG/ML IV SOLN
INTRAVENOUS | Status: DC | PRN
  Administered 2015-08-20: 50 mg via INTRAVENOUS

## 2015-08-20 MED ORDER — PHENYLEPHRINE HCL 10 MG/ML IJ SOLN
10.0000 mg | INTRAVENOUS | Status: DC | PRN
  Administered 2015-08-20: 10 ug/min via INTRAVENOUS

## 2015-08-20 MED ORDER — PROPOFOL 10 MG/ML IV BOLUS
INTRAVENOUS | Status: DC | PRN
  Administered 2015-08-20: 150 mg via INTRAVENOUS

## 2015-08-20 MED ORDER — ACETAMINOPHEN 650 MG RE SUPP: 650.0000 mg | RECTAL | Status: DC | PRN

## 2015-08-20 MED ORDER — ALBUTEROL SULFATE HFA 108 (90 BASE) MCG/ACT IN AERS: 2.0000 | INHALATION_SPRAY | Freq: Every day | RESPIRATORY_TRACT | Status: DC | PRN

## 2015-08-20 MED ORDER — METHYLENE BLUE 0.5 % INJ SOLN
INTRAVENOUS | Status: DC | PRN
  Administered 2015-08-20: .2 mL via INTRADERMAL

## 2015-08-20 MED ORDER — LEVOTHYROXINE SODIUM 88 MCG PO TABS
88.0000 ug | ORAL_TABLET | Freq: Every day | ORAL | Status: DC
  Administered 2015-08-21: 88 ug via ORAL
  Filled 2015-08-20: qty 1

## 2015-08-20 MED ORDER — SODIUM CHLORIDE 0.9% FLUSH: 3.0000 mL | Freq: Two times a day (BID) | INTRAVENOUS | Status: DC

## 2015-08-20 MED ORDER — OXYCODONE HCL 5 MG PO TABS
5.0000 mg | ORAL_TABLET | Freq: Once | ORAL | Status: AC | PRN
  Administered 2015-08-20: 5 mg via ORAL

## 2015-08-20 SURGICAL SUPPLY — 85 items
BENZOIN TINCTURE PRP APPL 2/3 (GAUZE/BANDAGES/DRESSINGS) ×3 IMPLANT
BLADE SURG ROTATE 9660 (MISCELLANEOUS) IMPLANT
BUR PRESCISION 1.7 ELITE (BURR) ×6 IMPLANT
BUR ROUND PRECISION 4.0 (BURR) IMPLANT
BUR ROUND PRECISION 4.0MM (BURR)
BUR SABER RD CUTTING 3.0 (BURR) ×2 IMPLANT
BUR SABER RD CUTTING 3.0MM (BURR) ×1
CAGE CONCORDE BULLET 9X11X27 (Cage) ×1 IMPLANT
CAGE CONCORDE BULLET 9X11X27MM (Cage) ×1 IMPLANT
CAGE SPNL 5D BLT NOSE 27X9X11 (Cage) ×1 IMPLANT
CARTRIDGE OIL MAESTRO DRILL (MISCELLANEOUS) ×1 IMPLANT
CLOSURE STERI-STRIP 1/2X4 (GAUZE/BANDAGES/DRESSINGS) ×1
CLOSURE WOUND 1/2 X4 (GAUZE/BANDAGES/DRESSINGS) ×2
CLSR STERI-STRIP ANTIMIC 1/2X4 (GAUZE/BANDAGES/DRESSINGS) ×2 IMPLANT
CONT SPEC STER OR (MISCELLANEOUS) ×3 IMPLANT
COVER MAYO STAND STRL (DRAPES) ×6 IMPLANT
COVER SURGICAL LIGHT HANDLE (MISCELLANEOUS) ×3 IMPLANT
DIFFUSER DRILL AIR PNEUMATIC (MISCELLANEOUS) ×3 IMPLANT
DRAIN CHANNEL 15F RND FF W/TCR (WOUND CARE) IMPLANT
DRAPE C-ARM 42X72 X-RAY (DRAPES) ×3 IMPLANT
DRAPE C-ARMOR (DRAPES) ×3 IMPLANT
DRAPE POUCH INSTRU U-SHP 10X18 (DRAPES) ×3 IMPLANT
DRAPE SURG 17X23 STRL (DRAPES) ×12 IMPLANT
DURAPREP 26ML APPLICATOR (WOUND CARE) ×3 IMPLANT
ELECT BLADE 4.0 EZ CLEAN MEGAD (MISCELLANEOUS) ×3
ELECT CAUTERY BLADE 6.4 (BLADE) ×3 IMPLANT
ELECT REM PT RETURN 9FT ADLT (ELECTROSURGICAL) ×3
ELECTRODE BLDE 4.0 EZ CLN MEGD (MISCELLANEOUS) ×1 IMPLANT
ELECTRODE REM PT RTRN 9FT ADLT (ELECTROSURGICAL) ×1 IMPLANT
EVACUATOR SILICONE 100CC (DRAIN) IMPLANT
GAUZE SPONGE 4X4 12PLY STRL (GAUZE/BANDAGES/DRESSINGS) ×3 IMPLANT
GAUZE SPONGE 4X4 16PLY XRAY LF (GAUZE/BANDAGES/DRESSINGS) ×12 IMPLANT
GLOVE BIO SURGEON STRL SZ7 (GLOVE) ×12 IMPLANT
GLOVE BIO SURGEON STRL SZ8 (GLOVE) ×3 IMPLANT
GLOVE BIOGEL PI IND STRL 7.0 (GLOVE) ×1 IMPLANT
GLOVE BIOGEL PI IND STRL 8 (GLOVE) ×1 IMPLANT
GLOVE BIOGEL PI INDICATOR 7.0 (GLOVE) ×2
GLOVE BIOGEL PI INDICATOR 8 (GLOVE) ×2
GOWN STRL REUS W/ TWL LRG LVL3 (GOWN DISPOSABLE) ×2 IMPLANT
GOWN STRL REUS W/ TWL XL LVL3 (GOWN DISPOSABLE) ×1 IMPLANT
GOWN STRL REUS W/TWL LRG LVL3 (GOWN DISPOSABLE) ×4
GOWN STRL REUS W/TWL XL LVL3 (GOWN DISPOSABLE) ×2
IV CATH 14GX2 1/4 (CATHETERS) ×3 IMPLANT
KIT BASIN OR (CUSTOM PROCEDURE TRAY) ×3 IMPLANT
KIT POSITION SURG JACKSON T1 (MISCELLANEOUS) ×3 IMPLANT
KIT ROOM TURNOVER OR (KITS) ×3 IMPLANT
MARKER SKIN DUAL TIP RULER LAB (MISCELLANEOUS) ×3 IMPLANT
MIX DBX 10CC 35% BONE (Bone Implant) ×3 IMPLANT
NDL SAFETY ECLIPSE 18X1.5 (NEEDLE) ×1 IMPLANT
NEEDLE 22X1 1/2 (OR ONLY) (NEEDLE) ×3 IMPLANT
NEEDLE HYPO 18GX1.5 SHARP (NEEDLE) ×2
NEEDLE HYPO 25GX1X1/2 BEV (NEEDLE) ×3 IMPLANT
NEEDLE SPNL 18GX3.5 QUINCKE PK (NEEDLE) ×6 IMPLANT
NS IRRIG 1000ML POUR BTL (IV SOLUTION) ×3 IMPLANT
OIL CARTRIDGE MAESTRO DRILL (MISCELLANEOUS) ×3
PACK LAMINECTOMY ORTHO (CUSTOM PROCEDURE TRAY) ×3 IMPLANT
PACK UNIVERSAL I (CUSTOM PROCEDURE TRAY) ×3 IMPLANT
PAD ARMBOARD 7.5X6 YLW CONV (MISCELLANEOUS) ×6 IMPLANT
PATTIES SURGICAL .5 X1 (DISPOSABLE) ×3 IMPLANT
PATTIES SURGICAL .5X1.5 (GAUZE/BANDAGES/DRESSINGS) ×3 IMPLANT
ROD PRE BENT EXP 40MM (Rod) ×3 IMPLANT
ROD PRE BENT EXPEDIUM 35MM (Rod) ×3 IMPLANT
SCREW SET SINGLE INNER (Screw) ×12 IMPLANT
SCREW VIPER CORT FIX 6X35 (Screw) ×12 IMPLANT
SPONGE GAUZE 4X4 12PLY STER LF (GAUZE/BANDAGES/DRESSINGS) ×3 IMPLANT
SPONGE INTESTINAL PEANUT (DISPOSABLE) ×3 IMPLANT
SPONGE SURGIFOAM ABS GEL 100 (HEMOSTASIS) ×3 IMPLANT
STRIP CLOSURE SKIN 1/2X4 (GAUZE/BANDAGES/DRESSINGS) ×4 IMPLANT
SURGIFLO W/THROMBIN 8M KIT (HEMOSTASIS) IMPLANT
SUT MNCRL AB 4-0 PS2 18 (SUTURE) ×6 IMPLANT
SUT VIC AB 0 CT1 18XCR BRD 8 (SUTURE) ×1 IMPLANT
SUT VIC AB 0 CT1 8-18 (SUTURE) ×2
SUT VIC AB 1 CT1 18XCR BRD 8 (SUTURE) ×2 IMPLANT
SUT VIC AB 1 CT1 8-18 (SUTURE) ×4
SUT VIC AB 2-0 CT2 18 VCP726D (SUTURE) ×3 IMPLANT
SYR 20CC LL (SYRINGE) ×3 IMPLANT
SYR BULB IRRIGATION 50ML (SYRINGE) ×3 IMPLANT
SYR CONTROL 10ML LL (SYRINGE) ×6 IMPLANT
SYR TB 1ML LUER SLIP (SYRINGE) ×3 IMPLANT
TAPE CLOTH SURG 6X10 WHT LF (GAUZE/BANDAGES/DRESSINGS) ×3 IMPLANT
TOWEL OR 17X24 6PK STRL BLUE (TOWEL DISPOSABLE) ×3 IMPLANT
TOWEL OR 17X26 10 PK STRL BLUE (TOWEL DISPOSABLE) ×3 IMPLANT
TRAY FOLEY CATH 16FRSI W/METER (SET/KITS/TRAYS/PACK) ×3 IMPLANT
WATER STERILE IRR 1000ML POUR (IV SOLUTION) ×3 IMPLANT
YANKAUER SUCT BULB TIP NO VENT (SUCTIONS) ×3 IMPLANT

## 2015-08-20 NOTE — Transfer of Care (Signed)
Immediate Anesthesia Transfer of Care Note  Patient: Brenda Bonilla  Procedure(s) Performed: Procedure(s) with comments: LEFT SIDED LUMBAR 5-SACRUM 1 TRANSFORAMINAL LUMAR INTERBODY FUSION WITH INSTRUMENTATION AND ALLOGRAFT (Left) - LEFT SIDED LUMBAR 5-SACRUM 1 TRANSFORAMINAL LUMAR INTERBODY FUSION WITH INSTRUMENTATION AND ALLOGRAFT  Patient Location: PACU  Anesthesia Type:General  Level of Consciousness: awake, alert , oriented and patient cooperative  Airway & Oxygen Therapy: Patient Spontanous Breathing and Patient connected to nasal cannula oxygen  Post-op Assessment: Report given to RN, Post -op Vital signs reviewed and stable and Patient moving all extremities X 4  Post vital signs: Reviewed and stable  Last Vitals:  Filed Vitals:   08/20/15 0943  BP: 122/90  Pulse: 69  Temp: 36.8 C  Resp: 18    Last Pain:  Filed Vitals:   08/20/15 0953  PainSc: 2          Complications: No apparent anesthesia complications

## 2015-08-20 NOTE — Anesthesia Postprocedure Evaluation (Signed)
Anesthesia Post Note  Patient: Brenda Bonilla  Procedure(s) Performed: Procedure(s) (LRB): LEFT SIDED LUMBAR 5-SACRUM 1 TRANSFORAMINAL LUMAR INTERBODY FUSION WITH INSTRUMENTATION AND ALLOGRAFT (Left)  Patient location during evaluation: PACU Anesthesia Type: General Level of consciousness: awake and alert and patient cooperative Pain management: pain level controlled Vital Signs Assessment: post-procedure vital signs reviewed and stable Respiratory status: spontaneous breathing and respiratory function stable Cardiovascular status: stable Anesthetic complications: no    Last Vitals:  Filed Vitals:   08/20/15 1720 08/20/15 1735  BP: 100/60 97/64  Pulse: 77 72  Temp:    Resp: 13 13    Last Pain:  Filed Vitals:   08/20/15 1736  PainSc: Asleep                 [name] S

## 2015-08-20 NOTE — Anesthesia Preprocedure Evaluation (Addendum)
Anesthesia Evaluation  Patient identified by MRN, date of birth, ID band Patient awake    Reviewed: Allergy & Precautions, NPO status , Patient's Chart, lab work & pertinent test results  Airway Mallampati: II  TM Distance: >3 FB Neck ROM: Full    Dental  (+) Teeth Intact, Dental Advisory Given   Pulmonary asthma ,    breath sounds clear to auscultation       Cardiovascular  Rhythm:Regular Rate:Normal     Neuro/Psych  Headaches, Anxiety Depression CVA, Residual Symptoms    GI/Hepatic GERD  Medicated and Controlled,  Endo/Other  Hypothyroidism   Renal/GU      Musculoskeletal   Abdominal   Peds  Hematology   Anesthesia Other Findings   Reproductive/Obstetrics                           Anesthesia Physical Anesthesia Plan  ASA: II  Anesthesia Plan: General   Post-op Pain Management:    Induction: Intravenous  Airway Management Planned: Oral ETT  Additional Equipment:   Intra-op Plan:   Post-operative Plan: Extubation in OR  Informed Consent: I have reviewed the patients History and Physical, chart, labs and discussed the procedure including the risks, benefits and alternatives for the proposed anesthesia with the patient or authorized representative who has indicated his/her understanding and acceptance.   Dental advisory given  Plan Discussed with: CRNA and Anesthesiologist  Anesthesia Plan Comments:         Anesthesia Quick Evaluation

## 2015-08-20 NOTE — Anesthesia Procedure Notes (Signed)
Procedure Name: Intubation Date/Time: 08/20/2015 12:00 PM Performed by: Dorie RankQUINN, Adan Beal M Pre-anesthesia Checklist: Patient identified, Emergency Drugs available, Suction available, Patient being monitored and Timeout performed Patient Re-evaluated:Patient Re-evaluated prior to inductionOxygen Delivery Method: Circle system utilized Preoxygenation: Pre-oxygenation with 100% oxygen Intubation Type: IV induction Ventilation: Mask ventilation without difficulty Laryngoscope Size: Mac and 3 Grade View: Grade I Tube type: Oral Tube size: 7.5 mm Number of attempts: 1 Airway Equipment and Method: Stylet Placement Confirmation: ETT inserted through vocal cords under direct vision,  positive ETCO2 and breath sounds checked- equal and bilateral Secured at: 22 cm Tube secured with: Tape Dental Injury: Teeth and Oropharynx as per pre-operative assessment

## 2015-08-21 MED FILL — Sodium Chloride IV Soln 0.9%: INTRAVENOUS | Qty: 1000 | Status: AC

## 2015-08-21 MED FILL — Heparin Sodium (Porcine) Inj 1000 Unit/ML: INTRAMUSCULAR | Qty: 30 | Status: AC

## 2015-08-21 NOTE — Evaluation (Signed)
Occupational Therapy Evaluation and Discharge Patient Details Name: Brenda Bonilla MMelis Trochez045 DOB: December 14, 1975 Today's Date: 08/21/2015    History of Present Illness Pt is a 40 y.o. female s/p L5-S1 TLIF. PMHx: Mitral valve disorder, Murmur, Anxiety, Hyperlipidemia, GERD, Hypothyroidism, Stroke, Asthma, IBS, Depression, Chronic back pain.     Clinical Impression   Pt reports she was managing ADLs independently PTA. Currently pt is overall supervision to mod I for ADLs and functional mobility. All back, safety, and ADL education completed with pt and her parents. Pt unsure if she will return to her apartment or her boyfriends home upon d/c; but will have 24/7 supervision from family initially. No further acute OT needs identified; signing off at this time. Please re-consult if needs change. Thank you for this referral.     Follow Up Recommendations  No OT follow up;Supervision - Intermittent    Equipment Recommendations  3 in 1 bedside comode    Recommendations for Other Services       Precautions / Restrictions Precautions Precautions: Back;Fall Precaution Booklet Issued: No Precaution Comments: Pt able to recall 2/3 precautions, reviewed all precautions with pt and family. Required Braces or Orthoses: Spinal Brace Spinal Brace: Thoracolumbosacral orthotic Restrictions Weight Bearing Restrictions: No      Mobility Bed Mobility               General bed mobility comments: Pt OOB in chair upon arrival. Pt able to verbally recall bed mobility technique.  Transfers Overall transfer level: Needs assistance Equipment used: None Transfers: Sit to/from Stand Sit to Stand: Supervision         General transfer comment: Supervision for safety; no physical assist required.    Balance Overall balance assessment: No apparent balance deficits (not formally assessed)                                          ADL Overall ADL's : Needs  assistance/impaired Eating/Feeding: Independent   Grooming: Modified independent;Standing Grooming Details (indicate cue type and reason): Educated pt on use of 2 cups for oral care. Upper Body Bathing: Modified independent;Sitting   Lower Body Bathing: Supervison/ safety;Sit to/from stand   Upper Body Dressing : Modified independent;Sitting Upper Body Dressing Details (indicate cue type and reason): Pt reports she was able to don back brace independently this AM. Lower Body Dressing: Supervision/safety;Sit to/from stand Lower Body Dressing Details (indicate cue type and reason): Pt able to cross foot over opposite knee. Educated on LB dressing techniques.  Toilet Transfer: Supervision/safety;Ambulation;BSC (BSC over toilet) Toilet Transfer Details (indicate cue type and reason): Simulated by sit to stand from chair Toileting- Clothing Manipulation and Hygiene: Supervision/safety;Sit to/from stand   Tub/ Engineer, structural: Walk-in shower;Tub transfer;Supervision/safety;Ambulation;Shower seat;3 in 1 Tub/Shower Transfer Details (indicate cue type and reason): Pt able to perform walk in shower transfer and simulated tub transfer with supervision for safety. Functional mobility during ADLs: Supervision/safety General ADL Comments: Educated pt on maintaining back precautions during functional activities, use of 3 in 1 over toilet and in tub/shower as a seat (provided handout for use of 3 in 1 in the tub).      Vision     Perception     Praxis      Pertinent Vitals/Pain Pain Assessment: Faces Faces Pain Scale: Hurts a little bit Pain Location: lower back Pain Descriptors / Indicators: Sore;Tingling Pain Intervention(s): Monitored during session  Hand Dominance     Extremity/Trunk Assessment Upper Extremity Assessment Upper Extremity Assessment: Overall WFL for tasks assessed   Lower Extremity Assessment Lower Extremity Assessment: Defer to PT evaluation   Cervical / Trunk  Assessment Cervical / Trunk Assessment: Other exceptions Cervical / Trunk Exceptions: s/p lumbar sx   Communication Communication Communication: No difficulties   Cognition Arousal/Alertness: Awake/alert Behavior During Therapy: WFL for tasks assessed/performed Overall Cognitive Status: Within Functional Limits for tasks assessed       Memory: Decreased recall of precautions             General Comments       Exercises       Shoulder Instructions      Home Living Family/patient expects to be discharged to:: Private residence Living Arrangements: Alone Available Help at Discharge: Family;Available 24 hours/day (initially) Type of Home: Apartment Home Access: Stairs to enter Entrance Stairs-Number of Steps: flight   Home Layout: One level     Bathroom Shower/Tub: Tub/shower unit Shower/tub characteristics: Engineer, building servicesCurtain Bathroom Toilet: Standard     Home Equipment: Environmental consultantWalker - 2 wheels;Toilet riser   Additional Comments: Unsure if pt will d/c to her apartment or boyfriends home. Above information reflects pts apartment. Boyfriends house has a walk in shower with built in seat and hand held shower head.      Prior Functioning/Environment Level of Independence: Independent             OT Diagnosis: Generalized weakness;Acute pain   OT Problem List:     OT Treatment/Interventions:      OT Goals(Current goals can be found in the care plan section) Acute Rehab OT Goals OT Goal Formulation: All assessment and education complete, DC therapy  OT Frequency:     Barriers to D/C:            Co-evaluation              End of Session Equipment Utilized During Treatment: Back brace Nurse Communication: Other (comment) (recommending 3 in 1 for home)  Activity Tolerance: Patient tolerated treatment well Patient left: with family/visitor present   Time: [id number] OT Time Calculation (min): 23 min Charges:  OT General Charges $OT Visit: 1 Procedure OT  Evaluation $OT Eval Low Complexity: 1 Procedure OT Treatments $Self Care/Home Management : 8-22 mins G-Codes:     Gaye AlkenBailey A Ruhani Umland M.S., OTR/L Pager: [phone]  08/21/2015, 8:55 AM

## 2015-08-21 NOTE — Op Note (Signed)
NAMETYREISHA, UNGAR NO.:  [id number]  MEDICAL RECORD NO.:  [id number]  LOCATION:  3C04C                        FACILITY:  [facility]  PHYSICIAN:  Estill Bamberg, MD      DATE OF BIRTH:  1975/07/15  DATE OF PROCEDURE:  08/20/2015                              OPERATIVE REPORT   PREOPERATIVE DIAGNOSES: 1. Bilateral L5 pars interarticularis defect. 2. Grade 1 L5-S1 spondylolisthesis. 3. Very large L5-S1 disk herniation, severely compressing the left S1     nerve.  POSTOPERATIVE DIAGNOSES: 1. Bilateral L5 pars interarticularis defect. 2. Grade 1 L5-S1 spondylolisthesis. 3. Very large L5-S1 disk herniation, severely compressing the left S1     nerve.  PROCEDURE: 1. Gill type laminectomy with removal of L5 Gill fragment and     decompression of the right and left L5-S1 lateral recess. 2. Removal of large L5/S1 disc hernation 3. Left-sided L5-S1 transforaminal lumbar interbody fusion. 4. Right-sided L5-S1 posterolateral fusion. 5. Insertion of interbody device x1 (Concorde bullet intervertebral     spacer, 11 x 27 x 9 mm spacer, Lordotic). 6. Placement of posterior instrumentation L5, S1 (6 x 35 mm screws). 7. Use of local autograft. 8. Use of morselized allograft-DBX mix. 9. Intraoperative use of fluoroscopy.  SURGEON:  Estill Bamberg, MD.  ASSISTANTJason Coop, PA-C.  ANESTHESIA:  General endotracheal anesthesia.  COMPLICATIONS:  None.  DISPOSITION:  Stable.  ESTIMATED BLOOD LOSS:  100 mL.  INDICATIONS FOR SURGERY:  Briefly, Latresha is a very pleasant 40 year old female, who did present to me with severe pain in her left leg.  At the time of my evaluation with her, she did have an 8-9 month history of severe pain in the left leg.  She also had pain in her back.  She was noted to have a spondylolisthesis and a pars defect.  I did obtain an MRI which did reveal a very large left-sided L5-S1 disk herniation.  We then discussed various forms of  treatment.  She did feel that her symptoms increased dramatically, and upon additional followup, she did feel progressive weakness in her legs and a significant increase in her pain.  She did have a positive straight leg raising, did lack of Achilles reflex on the left.  Given the findings on her MRI and x-rays and ongoing debilitating pain, we did discuss proceeding with the procedure reflected above.  The patient did elect to proceed.  OPERATIVE DETAILS:  On Aug 20, 2015, the patient was brought to surgery and general endotracheal anesthesia was administered.  The patient was placed prone on a well-padded flat Jackson bed with a Wilson frame. Antibiotics were given.  All bony prominences were meticulously padded. The back was prepped and draped.  A time-out was performed.  A midline incision was then made overlying the L5-S1 intervertebral space.  The fascia was incised at the midline.  An obvious Gill fragment was identified associated with the L5 level.  This was a free fragment, and there was obvious motion of the posterior elements of L5.  I did remove the Gill fragment, thereby partially decompressing the L5-S1 intervertebral space.  Using anatomic landmarks in addition to AP and lateral fluoroscopy, I did  cannulate the L5 and S1 pedicles bilaterally using a gearshift probe, followed by taps.  I did tap up to a 6 mm tap bilaterally.  On the right side, I did place 6 x 35 mm screws at L5 and at S1.  A 35 mm rod was placed and distraction was applied across the rod.  The screws were tested using triggered EMG, and there was no screw that tested below 12 milliamps.  After placing a 35 mm rod, distraction was applied across the L5-S1 intervertebral space.  Caps were placed and provisionally tightened.  On the left side, bone wax was placed in the cannulated pedicle holes.  I then removed the ligamentum flavum on the left side.  The traversing left S1 nerve was identified and noted to  be under substantial tension.  I was able to retract the left S1 nerve medially.  With an assistant holding medial retraction, I did remove a very large L5-S1 intervertebral disk fragment.  This did additionally decompress the left S1 nerve.  With an assistant holding medial retraction of the left S1 nerve, I did use a 15-blade knife to perform a thorough and complete L5-S1 intervertebral diskectomy.  I was very pleased with the diskectomy.  The endplates were prepared appropriately. The intervertebral space was then liberally packed with allograft and autograft.  The appropriate sized interbody spacer was also packed with allograft and autograft and tamped into position.  I was very pleased with the press-fit of the intervertebral implant.  Distraction was then discontinued on the right contralateral side.  I did bur the posterolateral gutter on the right side.  An additional autograft and allograft was packed into the posterolateral gutter.  On the left side, 6 x 35 mm screws were placed, a 40 mm rod was also placed, caps were then placed over the rods and provisionally tightened.  A final locking procedure was then performed on the right and left sides.  I did use triggered EMG to test the screws on the left as well, and there was no screw that tested below 12 milliamps.  Of note, the wound was copiously irrigated continuously throughout the surgery, with a total of approximately 3 L of normal saline.  I was very pleased with the decompression and the final AP and lateral fluoroscopic images.  The wound was then closed in layers using #1 Vicryl followed by 2-0 Vicryl, followed by 3-0 Monocryl.  Benzoin and Steri-Strips were applied followed by sterile dressing.  All instrument counts were correct at the termination of the procedure.  Of note, I did use neurologic monitoring throughout the surgery, and there was no abnormal EMG activity noted throughout the entire surgery from start  to finish.  Also of note, Jason CoopKayla McKenzie was my assistant throughout surgery, and did aid in retraction, suctioning, and closure from start to finish.     Estill BambergMark Skanda Worlds, MD     MD/MEDQ  D:  08/20/2015  T:  08/21/2015  Job:  [id number]  cc:   Verlee RossettiJoyce brown, Nurse Practitioner

## 2015-08-21 NOTE — Evaluation (Signed)
Physical Therapy Evaluation and Discharge Patient Details Name: Brenda Bonilla MRN: [id number] DOB: [birth year] Today's Date: 08/21/2015   History of Present Illness  Pt is a 40 y.o. female s/p L5-S1 TLIF. PMHx: Mitral valve disorder, Murmur, Anxiety, Hyperlipidemia, GERD, Hypothyroidism, Stroke, Asthma, IBS, Depression, Chronic back pain.    Clinical Impression  Patient evaluated by Physical Therapy with no further acute PT needs identified. All education has been completed and the patient has no further questions. At the time of PT eval pt was able to perform transfers and ambulation with modified independence. See below for any follow-up Physical Therapy or equipment needs. PT is signing off. Thank you for this referral.     Follow Up Recommendations Outpatient PT;Supervision - Intermittent    Equipment Recommendations  None recommended by PT    Recommendations for Other Services       Precautions / Restrictions Precautions Precautions: Back;Fall Precaution Booklet Issued: No Precaution Comments: Pt able to recall 2/3 precautions, reviewed all precautions with pt and family. Required Braces or Orthoses: Spinal Brace Spinal Brace: Thoracolumbosacral orthotic Restrictions Weight Bearing Restrictions: No      Mobility  Bed Mobility               General bed mobility comments: Pt standing in room upon arrival. Reviewed log roll technique.   Transfers Overall transfer level: Modified independent Equipment used: None Transfers: Sit to/from Stand Sit to Stand: Supervision         General transfer comment: No physical assist required. No unsteadiness noted.  Ambulation/Gait Ambulation/Gait assistance: Modified independent (Device/Increase time) Ambulation Distance (Feet): 400 Feet Assistive device: None Gait Pattern/deviations: Step-through pattern;Decreased stride length Gait velocity: Decreased Gait velocity interpretation: Below normal speed for  age/gender General Gait Details: Slow and guarded but without unsteadiness or LOB.   Stairs Stairs: Yes Stairs assistance: Modified independent (Device/Increase time) Stair Management: One rail Left;Step to pattern;Forwards Number of Stairs: 10 General stair comments: VC's for sequencing and general safety.   Wheelchair Mobility    Modified Rankin (Stroke Patients Only)       Balance Overall balance assessment: No apparent balance deficits (not formally assessed)                                           Pertinent Vitals/Pain Pain Assessment: Faces Faces Pain Scale: Hurts a little bit Pain Location: Incision site Pain Descriptors / Indicators: Operative site guarding;Tingling Pain Intervention(s): Limited activity within patient's tolerance;Monitored during session;Repositioned    Home Living Family/patient expects to be discharged to:: Private residence Living Arrangements: Alone Available Help at Discharge: Family;Available 24 hours/day (initially) Type of Home: Apartment Home Access: Stairs to enter   Entrance Stairs-Number of Steps: flight Home Layout: One level Home Equipment: Walker - 2 wheels;Toilet riser Additional Comments: Unsure if pt will d/c to her apartment or boyfriends home. Above information reflects pts apartment. Boyfriends house has a walk in shower with built in seat and hand held shower head.    Prior Function Level of Independence: Independent               Hand Dominance   Dominant Hand: Right    Extremity/Trunk Assessment   Upper Extremity Assessment: Defer to OT evaluation           Lower Extremity Assessment: LLE deficits/detail   LLE Deficits / Details: Decreased strength consistent with pre-op diagnosis.  Cervical /  Trunk Assessment: Other exceptions  Communication   Communication: No difficulties  Cognition Arousal/Alertness: Awake/alert Behavior During Therapy: WFL for tasks  assessed/performed Overall Cognitive Status: Within Functional Limits for tasks assessed       Memory: Decreased recall of precautions              General Comments      Exercises        Assessment/Plan    PT Assessment Patent does not need any further PT services  PT Diagnosis Difficulty walking;Acute pain   PT Problem List    PT Treatment Interventions     PT Goals (Current goals can be found in the Care Plan section) Acute Rehab PT Goals Patient Stated Goal: Home today PT Goal Formulation: All assessment and education complete, DC therapy    Frequency     Barriers to discharge        Co-evaluation               End of Session Equipment Utilized During Treatment: Back brace Activity Tolerance: Patient tolerated treatment well Patient left: Other (comment) (Standing in room with family present waiting for OT to enter) Nurse Communication: Mobility status         Time: [id number] PT Time Calculation (min) (ACUTE ONLY): 18 min   Charges:   PT Evaluation $PT Eval Moderate Complexity: 1 Procedure     PT G Codes:        Brenda SlipperKirkman, Brenda Bonilla 08/21/2015, 11:37 AM   Brenda SlipperLaura Anthone Bonilla, PT, DPT Acute Rehabilitation Services Pager: [phone]

## 2015-08-21 NOTE — Progress Notes (Signed)
    Patient doing well + expected LBP Patient reports improvement in L leg pain, with intermittent but improved symptoms Has been ambulating   Physical Exam: Filed Vitals:   08/20/15 2342 08/21/15 0513  BP: 112/67 94/57  Pulse: 91 90  Temp: 98.5 F (36.9 C) 98.8 F (37.1 C)  Resp: 18 18    Dressing in place NVI Brace applied, fitting appropriately  POD #1 s/p L5/S1 decompression and fusion, doing well with improved L letg pain  - up with PT/OT, encourage ambulation - brace when OOB - Percocet for pain, Valium for muscle spasms - likely d/c home today - residual L pain very likely to improve in time, likely from inflammation,

## 2015-08-21 NOTE — Progress Notes (Signed)
Pt doing well. Pt and family given D/C instructions with Rx's, verbal understanding was provided. Pt's incision is clean and dry with no sign of infection. Pt's IV was removed prior to D/C. Pt D/C'd home via wheelchair @ 1215 per MD order. Pt is stable @ D/C and has no other needs at this time. Denesia Donelan, RN  

## 2015-08-25 ENCOUNTER — Ambulatory Visit (INDEPENDENT_AMBULATORY_CARE_PROVIDER_SITE_OTHER): Payer: PRIVATE HEALTH INSURANCE

## 2015-08-25 DIAGNOSIS — J309 Allergic rhinitis, unspecified: Secondary | ICD-10-CM

## 2015-08-27 NOTE — Discharge Summary (Signed)
Patient ID: Brenda Bonilla MRN: [id number] DOB/AGE: 09/07/1975 40 y.o.  Admit date: 08/20/2015 Discharge date: 08/21/2015  Admission Diagnoses:  Active Problems:   Radiculopathy   Discharge Diagnoses:  Same  Past Medical History  Diagnosis Date  . Murmur, cardiac   . Mitral valve disorder   . Anxiety   . Hyperlipidemia     takes Pravastatin daily   . GERD (gastroesophageal reflux disease)     takes Omeprazole daily  . Hypothyroidism     takes SYnthroid daily  . Muscle spasm   . Stroke [facility](HCC) 2009    takes ASA 325 mg daily  . Asthma     Albuterol inhaler as needed  . Seasonal allergies     takes Zyrtec and Singulair daily.Also gets allergy shots  . History of bronchitis     several yrs ago  . Migraine     takes Topamax daily.HA regularly but has been a long time since migraine  . Chronic back pain     left sided radiculopathy.Scoliosis.Buldging disc.  . IBS (irritable bowel syndrome)     takes Bentyl daily  . Urinary frequency   . Nocturia   . Depression     has Fluoxetine if needed  . Insomnia     Surgeries: Procedure(s): LEFT SIDED LUMBAR 5-SACRUM 1 TRANSFORAMINAL LUMAR INTERBODY FUSION WITH INSTRUMENTATION AND ALLOGRAFT on 08/20/2015   Consultants:  None  Discharged Condition: Improved  Hospital Course: Brenda SkatesGarrie Nicolaou is an 40 y.o. female who was admitted 08/20/2015 for operative treatment of RADICULOPATHY. Patient has severe unremitting pain that affects sleep, daily activities, and work/hobbies. After pre-op clearance the patient was taken to the operating room on 08/20/2015 and underwent  Procedure(s): LEFT SIDED LUMBAR 5-SACRUM 1 TRANSFORAMINAL LUMAR INTERBODY FUSION WITH INSTRUMENTATION AND ALLOGRAFT.    Patient was given perioperative antibiotics:  Anti-infectives    Start     Dose/Rate Route Frequency Ordered Stop   08/20/15 2330  ceFAZolin (ANCEF) IVPB 1 g/50 mL premix     1 g 100 mL/hr over 30 Minutes Intravenous Every 8 hours 08/20/15 1852  08/21/15 0000   08/20/15 0945  ceFAZolin (ANCEF) IVPB 2g/100 mL premix     2 g 200 mL/hr over 30 Minutes Intravenous On call to O.R. 08/20/15 0945 08/20/15 1550       Patient was given sequential compression devices, early ambulation to prevent DVT.  Patient benefited maximally from hospital stay and there were no complications.    Recent vital signs: BP 103/61 mmHg  Pulse 83  Temp(Src) 98 F (36.7 C) (Oral)  Resp 18  Ht 5\' 2"  (1.575 m)  Wt 60.328 kg (133 lb)  BMI 24.32 kg/m2  SpO2 100%  LMP 08/06/2015  Discharge Medications:     Medication List    STOP taking these medications        ZADITOR 0.025 % ophthalmic solution  Generic drug:  ketotifen      TAKE these medications        albuterol 108 (90 Base) MCG/ACT inhaler  Commonly known as:  PROVENTIL HFA;VENTOLIN HFA  Inhale 2 puffs into the lungs daily as needed for wheezing or shortness of breath.     cetirizine 10 MG tablet  Commonly known as:  ZYRTEC  Take 10 mg by mouth daily.     dicyclomine 20 MG tablet  Commonly known as:  BENTYL  Take 20 mg by mouth daily as needed for spasms.     levothyroxine 88 MCG tablet  Commonly known  as:  SYNTHROID, LEVOTHROID  Take 88 mcg by mouth daily before breakfast.     montelukast 10 MG tablet  Commonly known as:  SINGULAIR  Take 10 mg by mouth daily.     omeprazole 20 MG capsule  Commonly known as:  PRILOSEC  Take 20 mg by mouth daily as needed.     PAZEO OP  Apply 1 drop to eye daily.     pravastatin 10 MG tablet  Commonly known as:  PRAVACHOL  Take 20 mg by mouth daily.     REPHRESH CLEAN BALANCE VA  Place 1 tablet vaginally daily.     topiramate 100 MG tablet  Commonly known as:  TOPAMAX  Take 100 mg by mouth 2 (two) times daily.     Vitamin D 2000 units Caps  Take 2,000 Units by mouth daily.        Diagnostic Studies: Dg Chest 2 View  08/13/2015  CLINICAL DATA:  Preop lumbar surgery. EXAM: CHEST  2 VIEW COMPARISON:  None. FINDINGS: The heart  size and mediastinal contours are within normal limits. Both lungs are clear. The visualized skeletal structures are unremarkable. IMPRESSION: No active cardiopulmonary disease. Electronically Signed   By: Charlett Nose M.D.   On: 08/13/2015 09:31   Dg Lumbar Spine 2-3 Views  08/20/2015  CLINICAL DATA:  Lumbar fusion L5-S1 level EXAM: DG C-ARM 61-120 MIN; LUMBAR SPINE - 2-3 VIEW COMPARISON:  Intraoperative localization film same day FINDINGS: Two views of the lumbar spine submitted. There is posterior fusion with transpedicular screws and metallic rods at U9-W1 level. There is anatomic alignment. Postsurgical disc material noted at L5-S1 level. IMPRESSION: Posterior metallic fusion L5-S1 level with alignment preserved. Fluoroscopy time was 50 seconds.  Please see the operative report. Electronically Signed   By: Natasha Mead M.D.   On: 08/20/2015 16:17   Dg Lumbar Spine 2-3 Views  08/20/2015  CLINICAL DATA:  Lumbar spine surgery. EXAM: LUMBAR SPINE - 2-3 VIEW COMPARISON:  None. FINDINGS: Two intraoperative cross-table lateral radiographs of the lumbar spine are provided. The first image demonstrates needles projecting at the tip of the L4 spinous process and at the S1-2 level. On the second image, the more superior needle has been repositioned to the L5 spinous process level. The more inferior needle remains at the S1-2 level. IMPRESSION: Intraoperative localization as above. Electronically Signed   By: Sebastian Ache M.D.   On: 08/20/2015 12:38   Dg C-arm 61-120 Min  08/20/2015  CLINICAL DATA:  Lumbar fusion L5-S1 level EXAM: DG C-ARM 61-120 MIN; LUMBAR SPINE - 2-3 VIEW COMPARISON:  Intraoperative localization film same day FINDINGS: Two views of the lumbar spine submitted. There is posterior fusion with transpedicular screws and metallic rods at X9-J4 level. There is anatomic alignment. Postsurgical disc material noted at L5-S1 level. IMPRESSION: Posterior metallic fusion L5-S1 level with alignment preserved.  Fluoroscopy time was 50 seconds.  Please see the operative report. Electronically Signed   By: Natasha Mead M.D.   On: 08/20/2015 16:17    Disposition: 01-Home or Self Care   POD #1 s/p L5/S1 decompression and fusion, doing well with improved L leg pain  -Written scripts for pain signed and in chart -D/C instructions sheet printed and in chart -D/C today  -F/U in office 2 weeks   Signed: Georga Bora 08/27/2015, 10:49 AM

## 2015-08-28 ENCOUNTER — Ambulatory Visit (INDEPENDENT_AMBULATORY_CARE_PROVIDER_SITE_OTHER): Payer: PRIVATE HEALTH INSURANCE | Admitting: *Deleted

## 2015-08-28 DIAGNOSIS — J309 Allergic rhinitis, unspecified: Secondary | ICD-10-CM | POA: Diagnosis not present

## 2015-09-04 ENCOUNTER — Ambulatory Visit (INDEPENDENT_AMBULATORY_CARE_PROVIDER_SITE_OTHER): Payer: PRIVATE HEALTH INSURANCE

## 2015-09-04 DIAGNOSIS — J309 Allergic rhinitis, unspecified: Secondary | ICD-10-CM

## 2015-09-08 ENCOUNTER — Ambulatory Visit (INDEPENDENT_AMBULATORY_CARE_PROVIDER_SITE_OTHER): Payer: PRIVATE HEALTH INSURANCE

## 2015-09-08 DIAGNOSIS — J309 Allergic rhinitis, unspecified: Secondary | ICD-10-CM

## 2015-09-15 ENCOUNTER — Ambulatory Visit (INDEPENDENT_AMBULATORY_CARE_PROVIDER_SITE_OTHER): Payer: PRIVATE HEALTH INSURANCE | Admitting: *Deleted

## 2015-09-15 DIAGNOSIS — J309 Allergic rhinitis, unspecified: Secondary | ICD-10-CM

## 2015-09-25 ENCOUNTER — Ambulatory Visit (INDEPENDENT_AMBULATORY_CARE_PROVIDER_SITE_OTHER): Payer: PRIVATE HEALTH INSURANCE | Admitting: *Deleted

## 2015-09-25 DIAGNOSIS — J309 Allergic rhinitis, unspecified: Secondary | ICD-10-CM | POA: Diagnosis not present

## 2015-10-03 ENCOUNTER — Ambulatory Visit (INDEPENDENT_AMBULATORY_CARE_PROVIDER_SITE_OTHER): Payer: PRIVATE HEALTH INSURANCE | Admitting: *Deleted

## 2015-10-03 DIAGNOSIS — J309 Allergic rhinitis, unspecified: Secondary | ICD-10-CM

## 2015-10-13 ENCOUNTER — Ambulatory Visit (INDEPENDENT_AMBULATORY_CARE_PROVIDER_SITE_OTHER): Payer: PRIVATE HEALTH INSURANCE

## 2015-10-13 DIAGNOSIS — J309 Allergic rhinitis, unspecified: Secondary | ICD-10-CM

## 2015-10-16 ENCOUNTER — Ambulatory Visit (INDEPENDENT_AMBULATORY_CARE_PROVIDER_SITE_OTHER): Payer: PRIVATE HEALTH INSURANCE | Admitting: *Deleted

## 2015-10-16 DIAGNOSIS — J309 Allergic rhinitis, unspecified: Secondary | ICD-10-CM | POA: Diagnosis not present

## 2015-10-23 ENCOUNTER — Ambulatory Visit (INDEPENDENT_AMBULATORY_CARE_PROVIDER_SITE_OTHER): Payer: PRIVATE HEALTH INSURANCE | Admitting: *Deleted

## 2015-10-23 DIAGNOSIS — J309 Allergic rhinitis, unspecified: Secondary | ICD-10-CM

## 2015-11-14 ENCOUNTER — Ambulatory Visit (INDEPENDENT_AMBULATORY_CARE_PROVIDER_SITE_OTHER): Payer: PRIVATE HEALTH INSURANCE

## 2015-11-14 DIAGNOSIS — J309 Allergic rhinitis, unspecified: Secondary | ICD-10-CM

## 2015-12-05 ENCOUNTER — Ambulatory Visit (INDEPENDENT_AMBULATORY_CARE_PROVIDER_SITE_OTHER): Payer: PRIVATE HEALTH INSURANCE | Admitting: *Deleted

## 2015-12-05 DIAGNOSIS — J309 Allergic rhinitis, unspecified: Secondary | ICD-10-CM | POA: Diagnosis not present

## 2016-01-16 ENCOUNTER — Ambulatory Visit (INDEPENDENT_AMBULATORY_CARE_PROVIDER_SITE_OTHER): Payer: PRIVATE HEALTH INSURANCE | Admitting: *Deleted

## 2016-01-16 DIAGNOSIS — J309 Allergic rhinitis, unspecified: Secondary | ICD-10-CM

## 2016-01-30 ENCOUNTER — Ambulatory Visit (INDEPENDENT_AMBULATORY_CARE_PROVIDER_SITE_OTHER): Payer: PRIVATE HEALTH INSURANCE

## 2016-01-30 DIAGNOSIS — J309 Allergic rhinitis, unspecified: Secondary | ICD-10-CM | POA: Diagnosis not present

## 2016-03-05 ENCOUNTER — Ambulatory Visit (INDEPENDENT_AMBULATORY_CARE_PROVIDER_SITE_OTHER): Payer: PRIVATE HEALTH INSURANCE | Admitting: *Deleted

## 2016-03-05 DIAGNOSIS — J309 Allergic rhinitis, unspecified: Secondary | ICD-10-CM | POA: Diagnosis not present

## 2016-05-19 IMAGING — RF DG LUMBAR SPINE 2-3V
1 series · 2 of 2 positions shown · non-contrast
Comparison: Intraoperative localization film same day

CLINICAL DATA: Lumbar fusion L5-S1 level

EXAM:
DG C-ARM 61-120 MIN; LUMBAR SPINE - 2-3 VIEW

[Series 1: run · 2 of 2 slices shown]
[im 1/2]
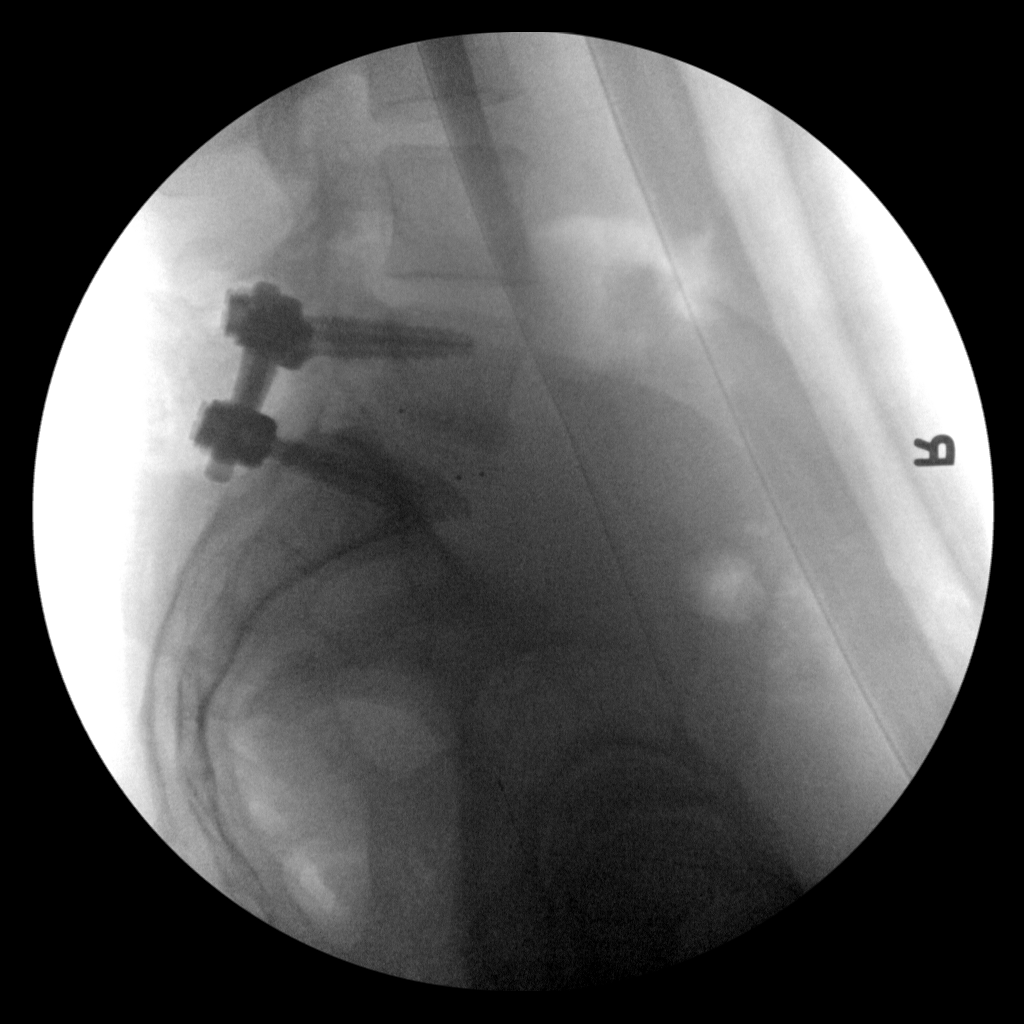
[im 2/2]
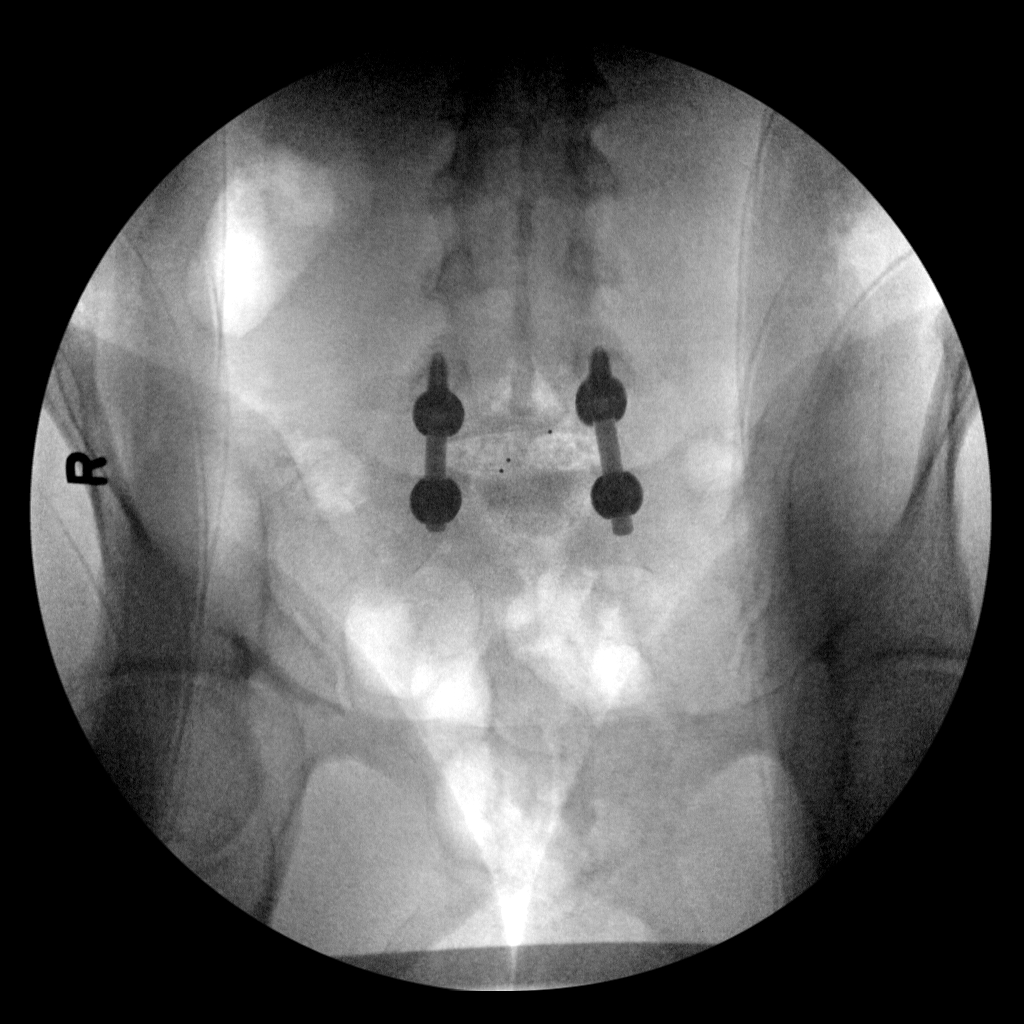

[2 of 2 positions shown; findings below may reference images not displayed]

FINDINGS: Two views of the lumbar spine submitted. There is posterior fusion
with transpedicular screws and metallic rods at L5-S1 level. There
is anatomic alignment. Postsurgical disc material noted at L5-S1
level.
IMPRESSION: Posterior metallic fusion L5-S1 level with alignment preserved.

Fluoroscopy time was 50 seconds.  Please see the operative report.

## 2016-06-01 NOTE — Addendum Note (Signed)
Addended by: Berna BueWHITAKER, Rosangela Fehrenbach L on: 06/01/2016 12:02 PM   Modules accepted: Orders

## 2017-08-04 ENCOUNTER — Encounter: Payer: Self-pay | Admitting: Gastroenterology

## 2017-08-09 ENCOUNTER — Ambulatory Visit: Payer: PRIVATE HEALTH INSURANCE | Admitting: Gastroenterology

## 2017-10-03 ENCOUNTER — Encounter: Payer: Self-pay | Admitting: Gastroenterology

## 2017-10-07 ENCOUNTER — Ambulatory Visit: Payer: PRIVATE HEALTH INSURANCE | Admitting: Gastroenterology

## 2017-11-15 ENCOUNTER — Encounter

## 2017-11-15 ENCOUNTER — Encounter: Payer: Self-pay | Admitting: Gastroenterology

## 2017-11-15 ENCOUNTER — Ambulatory Visit (INDEPENDENT_AMBULATORY_CARE_PROVIDER_SITE_OTHER): Payer: Self-pay | Admitting: Gastroenterology

## 2017-11-15 VITALS — BP 124/80 | HR 88 | Ht 62.0 in | Wt 136.2 lb

## 2017-11-15 DIAGNOSIS — K580 Irritable bowel syndrome with diarrhea: Secondary | ICD-10-CM

## 2017-11-15 DIAGNOSIS — K219 Gastro-esophageal reflux disease without esophagitis: Secondary | ICD-10-CM

## 2017-11-15 MED ORDER — DICYCLOMINE HCL 10 MG PO CAPS: 10.0000 mg | ORAL_CAPSULE | Freq: Two times a day (BID) | ORAL | 0 refills | Status: DC

## 2017-11-15 NOTE — Patient Instructions (Signed)
If you are age 40 or older, your body mass index should be between 23-30. Your Body mass index is 24.91 kg/m. If this is out of the aforementioned range listed, please consider follow up with your Primary Care Provider.  If you are age 40 or younger, your body mass index should be between 19-25. Your Body mass index is 24.91 kg/m. If this is out of the aformentioned range listed, please consider follow up with your Primary Care Provider.   You have been scheduled for an endoscopy. Please follow written instructions given to you at your visit today. If you use inhalers (even only as needed), please bring them with you on the day of your procedure. Your physician has requested that you go to www.startemmi.com and enter the access code given to you at your visit today. This web site gives a general overview about your procedure. However, you should still follow specific instructions given to you by our office regarding your preparation for the procedure.  Please stop at the lab on the 2nd floor [address]  before you leave the office today.   We have sent the following medications to your pharmacy for you to pick up at your convenience: Dicyclomine 10 mg twice daily/   Thank you,  Dr. Lynann Bolognaajesh Gupta

## 2017-11-15 NOTE — Progress Notes (Signed)
Chief Complaint: GERD  Referring Provider:  Gordan PaymentGrisso, Greg A., MD      ASSESSMENT AND PLAN;   #1. GERD  #2. IBS with diarrhea with bloating. - Bentyl 10 mg po bid-half an hour before lunch and at bedtime. - Stool for WBCs, GI path and C.diff - EGD with with possible small bowel and esophageal biopsies. - Trial of gluen free diet thereafter. - If still with problems will give her a trial of Viberzi. - Stop chewing gums. - Would like to hold off on colonoscopy at this time since she is not having any red flag symptoms.  I do believe that the yield of colonoscopy would be low.  However, she continues to have problems we would reconsider.   HPI:    Brenda Bonilla is a 42 y.o. female  Runner, broadcasting/film/videoAthletic director -works for [employer] and [employer] Long-standing history of reflux, over several years with associated heartburn and regurgitation despite of omeprazole  Getting worse over the last 2 to 3 months. occ abd pain with abdominal bloating and borborygmi Dx with endometriosis Diarrhea over last several years-intermittent- after eating, more with salads, no blood, bentyl does help a little bit.  Does admit that stress has some role to play as well.  Rare constipation especially when she travels.  Stool does change its color.  No melena or hematochezia.  No recent weight loss. More with milk and cheese Denies use of sodas or chocolates.  Does chew gum  Has been seen by GI in [place] several years ago.  History of stroke in 2009 attributed to birth control pills.  Has been having headaches.  She had MRI head this morning.     Past Medical History:  Diagnosis Date  . Anxiety   . Asthma    Albuterol inhaler as needed  . Bilateral nephrolithiasis   . Bilateral ovarian cysts   . Chronic back pain    left sided radiculopathy.Scoliosis.Buldging disc.  Marland Kitchen. Chronic kidney disease   . Depression    has Fluoxetine if needed  . Endometriosis   . GERD (gastroesophageal  reflux disease)    takes Omeprazole daily  . History of bronchitis    several yrs ago  . Hyperlipidemia    takes Pravastatin daily   . Hypothyroidism    takes SYnthroid daily  . IBS (irritable bowel syndrome)    takes Bentyl daily  . Insomnia   . Migraine    takes Topamax daily.HA regularly but has been a long time since migraine  . Mitral valve disorder   . Murmur, cardiac   . Muscle spasm   . Nocturia   . Seasonal allergies    takes Zyrtec and Singulair daily.Also gets allergy shots  . Stroke [facility](HCC) 2009   takes ASA 325 mg daily  . Urinary frequency   . Vitamin D deficiency     Past Surgical History:  Procedure Laterality Date  . COLONOSCOPY    . LASIK  2007  . LUMBAR LAMINECTOMY     spinal cord decompression  . wisdom teeth extracted       Family History  Problem Relation Age of Onset  . Hyperlipidemia Mother   . Heart disease Mother   . Heart failure Father   . Stroke Father   . Hypertension Father   . Hyperlipidemia Father   . Diabetes Father   . Stroke Paternal Grandmother   . Stroke Maternal Grandmother     Social History   Tobacco Use  .  Smoking status: Never Smoker  . Smokeless tobacco: Never Used  Substance Use Topics  . Alcohol use: Yes    Alcohol/week: 5.0 standard drinks    Types: 5 Standard drinks or equivalent per week    Comment: occasionally  . Drug use: No    Current Outpatient Medications  Medication Sig Dispense Refill  . albuterol (PROVENTIL HFA;VENTOLIN HFA) 108 (90 Base) MCG/ACT inhaler Inhale 2 puffs into the lungs daily as needed for wheezing or shortness of breath.    Marland Kitchen. aspirin 325 MG tablet Take 325 mg by mouth daily.    . cetirizine (ZYRTEC) 10 MG tablet Take 10 mg by mouth daily.    . Cholecalciferol (VITAMIN D) 2000 units CAPS Take 1,000 Units by mouth daily. Every other day    . cyanocobalamin 2000 MCG tablet Take 2,000 mcg by mouth daily.    Marland Kitchen. dicyclomine (BENTYL) 20 MG tablet Take 20 mg by mouth daily as needed for  spasms.     Marland Kitchen. levothyroxine (SYNTHROID, LEVOTHROID) 88 MCG tablet Take 88 mcg by mouth daily before breakfast.   0  . Miscellaneous Vaginal Products (REPHRESH CLEAN BALANCE VA) Take 1 tablet by mouth daily.     . montelukast (SINGULAIR) 10 MG tablet Take 10 mg by mouth daily.     . nortriptyline (PAMELOR) 25 MG capsule Take 25 mg by mouth at bedtime.    . Olopatadine HCl (PAZEO OP) Apply 1 drop to eye daily.    Marland Kitchen. omeprazole (PRILOSEC) 20 MG capsule Take 20 mg by mouth daily as needed.     . pravastatin (PRAVACHOL) 10 MG tablet Take 20 mg by mouth daily.     Marland Kitchen. topiramate (TOPAMAX) 100 MG tablet Take 100 mg by mouth 2 (two) times daily.     No current facility-administered medications for this visit.     No Known Allergies  Review of Systems:  Constitutional: Denies fever, chills, diaphoresis, appetite change and has fatigue.  HEENT: Denies photophobia, eye pain, redness, hearing loss, ear pain, congestion, sore throat, rhinorrhea, sneezing, mouth sores, neck pain, neck stiffness and tinnitus.   Respiratory: Denies SOB, DOE, cough, chest tightness,  and wheezing.   Cardiovascular: Denies chest pain, palpitations and leg swelling.  Genitourinary: Denies dysuria, urgency, frequency, hematuria, flank pain and difficulty urinating.  Musculoskeletal: Denies myalgias, back pain, joint swelling, arthralgias and gait problem.  Skin: No rash.  Neurological: Denies dizziness, seizures, syncope, weakness, light-headedness, numbness and has headaches.  Hematological: Denies adenopathy. Easy bruising, personal or family bleeding history  Psychiatric/Behavioral: has anxiety or depression     Physical Exam:    BP 124/80   Pulse 88   Ht 5\' 2"  (1.575 m)   Wt 136 lb 3.2 oz (61.8 kg)   SpO2 99%   BMI 24.91 kg/m  Filed Weights   11/15/17 1528  Weight: 136 lb 3.2 oz (61.8 kg)   Constitutional:  Well-developed, in no acute distress. Psychiatric: Normal mood and affect. Behavior is normal. HEENT:  Pupils normal.  Conjunctivae are normal. No scleral icterus. Neck supple.  Cardiovascular: Normal rate, regular rhythm. No edema Pulmonary/chest: Effort normal and breath sounds normal. No wheezing, rales or rhonchi. Abdominal: Soft, nondistended. Nontender. Bowel sounds active throughout. There are no masses palpable. No hepatomegaly. Rectal:  defered Neurological: Alert and oriented to person place and time. Skin: Skin is warm and dry. No rashes noted.  Data Reviewed: I have personally reviewed following labs and imaging studies  CBC: CBC Latest Ref Rng & Units 08/13/2015  WBC 4.0 - 10.5 K/uL 6.3  Hemoglobin 12.0 - 15.0 g/dL 16.1  Hematocrit 09.6 - 46.0 % 39.7  Platelets 150 - 400 K/uL 255    CMP: CMP Latest Ref Rng & Units 08/13/2015  Glucose 65 - 99 mg/dL 045(W)  BUN 6 - 20 mg/dL 09(W)  Creatinine 1.19 - 1.00 mg/dL 1.47  Sodium 829 - 562 mmol/L 139  Potassium 3.5 - 5.1 mmol/L 3.5  Chloride 101 - 111 mmol/L 110  CO2 22 - 32 mmol/L 20(L)  Calcium 8.9 - 10.3 mg/dL 9.6  Total Protein 6.5 - 8.1 g/dL 7.7  Total Bilirubin 0.3 - 1.2 mg/dL 0.6  Alkaline Phos 38 - 126 U/L 48  AST 15 - 41 U/L 19  ALT 14 - 54 U/L 18      Edman Circle, MD 11/15/2017, 3:47 PM  Cc: Gordan Payment., MD

## 2017-11-21 ENCOUNTER — Other Ambulatory Visit: Payer: Commercial Managed Care - PPO

## 2017-11-21 DIAGNOSIS — K580 Irritable bowel syndrome with diarrhea: Secondary | ICD-10-CM

## 2017-11-21 DIAGNOSIS — K219 Gastro-esophageal reflux disease without esophagitis: Secondary | ICD-10-CM

## 2017-11-24 ENCOUNTER — Encounter: Payer: Self-pay | Admitting: Gastroenterology

## 2017-11-24 LAB — GASTROINTESTINAL PATHOGEN PANEL PCR
C. difficile Tox A/B, PCR: NOT DETECTED
CAMPYLOBACTER, PCR: NOT DETECTED
Cryptosporidium, PCR: NOT DETECTED
E COLI 0157, PCR: NOT DETECTED
E coli (ETEC) LT/ST PCR: NOT DETECTED
E coli (STEC) stx1/stx2, PCR: NOT DETECTED
GIARDIA LAMBLIA, PCR: NOT DETECTED
Norovirus, PCR: NOT DETECTED
ROTAVIRUS, PCR: NOT DETECTED
SHIGELLA, PCR: NOT DETECTED
Salmonella, PCR: NOT DETECTED

## 2017-11-24 LAB — CLOSTRIDIUM DIFFICILE TOXIN B, QUALITATIVE, REAL-TIME PCR: Toxigenic C. Difficile by PCR: NOT DETECTED

## 2017-11-24 LAB — FECAL LACTOFERRIN, QUANT
FECAL LACTOFERRIN: NEGATIVE
MICRO NUMBER: 90983876
SPECIMEN QUALITY: ADEQUATE

## 2017-12-08 ENCOUNTER — Encounter: Payer: Commercial Managed Care - PPO | Admitting: Gastroenterology

## 2018-01-10 ENCOUNTER — Encounter: Payer: Commercial Managed Care - PPO | Admitting: Gastroenterology

## 2018-01-31 ENCOUNTER — Other Ambulatory Visit: Payer: Self-pay | Admitting: Gastroenterology

## 2018-06-05 ENCOUNTER — Other Ambulatory Visit: Payer: Self-pay | Admitting: Gastroenterology

## 2018-06-05 NOTE — Telephone Encounter (Signed)
Please address

## 2022-03-15 ENCOUNTER — Ambulatory Visit (INDEPENDENT_AMBULATORY_CARE_PROVIDER_SITE_OTHER): Payer: BC Managed Care – PPO | Admitting: Allergy and Immunology

## 2022-03-15 ENCOUNTER — Encounter: Payer: Self-pay | Admitting: Allergy and Immunology

## 2022-03-15 VITALS — BP 134/82 | HR 80 | Temp 98.1°F | Resp 20 | Ht 63.0 in | Wt 143.0 lb

## 2022-03-15 DIAGNOSIS — L253 Unspecified contact dermatitis due to other chemical products: Secondary | ICD-10-CM

## 2022-03-15 DIAGNOSIS — J4520 Mild intermittent asthma, uncomplicated: Secondary | ICD-10-CM

## 2022-03-15 DIAGNOSIS — J3089 Other allergic rhinitis: Secondary | ICD-10-CM | POA: Diagnosis not present

## 2022-03-15 DIAGNOSIS — H1013 Acute atopic conjunctivitis, bilateral: Secondary | ICD-10-CM | POA: Diagnosis not present

## 2022-03-15 DIAGNOSIS — J301 Allergic rhinitis due to pollen: Secondary | ICD-10-CM

## 2022-03-15 DIAGNOSIS — L2089 Other atopic dermatitis: Secondary | ICD-10-CM

## 2022-03-15 MED ORDER — AIRSUPRA 90-80 MCG/ACT IN AERO: 2.0000 | INHALATION_SPRAY | RESPIRATORY_TRACT | 1 refills | Status: DC | PRN

## 2022-03-15 MED ORDER — PIMECROLIMUS 1 % EX CREA: TOPICAL_CREAM | CUTANEOUS | 0 refills | Status: DC

## 2022-03-15 MED ORDER — TOBRAMYCIN-DEXAMETHASONE 0.3-0.1 % OP SUSP: OPHTHALMIC | 0 refills | Status: DC

## 2022-03-15 NOTE — Progress Notes (Unsigned)
[place]   Dear Richardson DoppJoyce Brown-Patram,  Thank you for referring Brenda SkatesGarrie Limehouse [facility] [place] on 03/15/2022.   Below is a summation of this patient's evaluation and recommendations.  Thank you for your referral. I will keep you informed about this patient's response to treatment.   If you have any questions please do not hesitate to contact me.   Sincerely,  Jessica PriestEric J. Lamerle Jabs, MD Allergy / Immunology [facility]   ______________________________________________________________________    NEW PATIENT NOTE  Referring Provider: Rhea BleacherBrown-Patram, Melissa J* Primary Provider: Krystal ClarkBrown-Patram, Melissa Joyce, NP Date of office visit: 03/15/2022    Subjective:   Chief Complaint:  Brenda Bonilla (DOB: 11/04/1975) is a 46 y.o. female who presents to the clinic on 03/15/2022 with a chief complaint of Rash and Allergies (Swollen eyes with discharge x months off and on ) .     HPI: Margot ChimesGarrie presents to this clinic in evaluation of skin and eye issues.  I had seen her in this clinic over 10 years ago when she was undergoing a course of immunotherapy although she had problems progressing secondary to immunotherapy induced allergic reactions.  Her situation changed in 2023.  Over the course of the past year she has developed eczema with patches of red inflamed skin affecting her scalp and ears and neck and antecubital fossa and legs for which she saw dermatology and has been treated with a collection of topical agents which really did not work that well and she was subsequently started on dupilumab about 6 weeks ago and also changed her environment having moved from 1 house to another house and she is now 90% better regarding her skin issue.  As well, she appeared to have a problem with her eyes that developed in the spring 2023 with redness and swelling and crusty  material and goopy material emanating from her eyes.  She had seen a optometrist, Dr. Gavin PottersKernodle, who gave her TobraDex and she has used 3 bottles of this agent since that point in time.  Her last use was in November 2023.  Once again, her eyes appear to be better since she started dupilumab and changed her environment.  Her airway issue is not a particularly big issue.  She does intermittently wheeze and she will use of bronchodilator about once every week to once every 2 weeks.  She does not really have much problem with exercise induced bronchospastic symptoms.  She uses cetirizine and montelukast and her upper airway issue appears to be going quite well.  She has not required a systemic steroid or antibiotic for any type of airway issue.  She was skin tested 25 December 2021 which documented sensitivity directed against tree pollen, grass pollen, dust mite, cat, dog, [place], horse  Past Medical History:  Diagnosis Date   Anxiety    Asthma    Albuterol inhaler as needed   Bilateral nephrolithiasis    Bilateral ovarian cysts    Chronic back pain    left sided radiculopathy.Scoliosis.Buldging disc.   Chronic kidney disease    Depression    has Fluoxetine if needed   Endometriosis    GERD (gastroesophageal reflux disease)    takes Omeprazole daily   History of bronchitis    several yrs ago   Hyperlipidemia    takes Pravastatin daily    Hypothyroidism    takes SYnthroid daily  IBS (irritable bowel syndrome)    takes Bentyl daily   Insomnia    Migraine    takes Topamax daily.HA regularly but has been a long time since migraine   Mitral valve disorder    Murmur, cardiac    Muscle spasm    Nocturia    Seasonal allergies    takes Zyrtec and Singulair daily.Also gets allergy shots   Stroke [facility]) 2009   takes ASA 325 mg daily   Urinary frequency    Vitamin D deficiency     Past Surgical History:  Procedure Laterality Date   COLONOSCOPY     LASIK  2007   LUMBAR LAMINECTOMY      spinal cord decompression   wisdom teeth extracted       Allergies as of 03/15/2022   No Known Allergies      Medication List    albuterol 108 (90 Base) MCG/ACT inhaler Commonly known as: VENTOLIN HFA Inhale 2 puffs into the lungs daily as needed for wheezing or shortness of breath.   aspirin 325 MG tablet Take 325 mg by mouth daily.   cetirizine 10 MG tablet Commonly known as: ZYRTEC Take 10 mg by mouth daily.   clobetasol 0.05 % external solution Commonly known as: TEMOVATE apply to scalp TWICE DAILY for 1-2 weeks AS NEEDED when flaring   dicyclomine 10 MG capsule Commonly known as: BENTYL Take 1 capsule (10 mg total) by mouth 2 (two) times daily at 8 am and 10 pm.   Dupixent 200 MG/1. Sopn Generic drug: Dupilumab   Emgality 120 MG/ML Soaj Generic drug: Galcanezumab-gnlm Inject 1 mL into the skin every 30 (thirty) days.   ketoconazole 2 % shampoo Commonly known as: NIZORAL apply to scalp EVERY OTHER DAY   levothyroxine 88 MCG tablet Commonly known as: SYNTHROID Take 88 mcg by mouth daily before breakfast.   montelukast 10 MG tablet Commonly known as: SINGULAIR Take 10 mg by mouth daily.   nortriptyline 25 MG capsule Commonly known as: PAMELOR Take 25 mg by mouth at bedtime.   omeprazole 20 MG capsule Commonly known as: PRILOSEC Take 20 mg by mouth daily as needed.   pravastatin 10 MG tablet Commonly known as: PRAVACHOL Take 20 mg by mouth daily.   REPHRESH CLEAN BALANCE VA Take 1 tablet by mouth daily.   Roflumilast 0.3 % Crea Apply topically.   sertraline 100 MG tablet Commonly known as: ZOLOFT Take 100 mg by mouth daily.   tobramycin-dexamethasone ophthalmic ointment Commonly known as: TOBRADEX 1 Application 3 (three) times daily.   topiramate 100 MG tablet Commonly known as: TOPAMAX Take 100 mg by mouth 2 (two) times daily.   triamcinolone 0.025 % cream Commonly known as: KENALOG Apply topically.   Vitamin D 50 MCG (2000  UT) Caps Take 1,000 Units by mouth daily. Every other day    Review of systems negative except as noted in HPI / PMHx or noted below:  Review of Systems  Constitutional: Negative.   HENT: Negative.    Eyes: Negative.   Respiratory: Negative.    Cardiovascular: Negative.   Gastrointestinal: Negative.   Genitourinary: Negative.   Musculoskeletal: Negative.   Skin: Negative.   Neurological: Negative.   Endo/Heme/Allergies: Negative.   Psychiatric/Behavioral: Negative.      Family History  Problem Relation Age of Onset   Allergic rhinitis Mother    Hyperlipidemia Mother    Heart disease Mother    Emphysema Mother    Allergic rhinitis Father  Eczema Father    Heart failure Father    Stroke Father    Hypertension Father    Hyperlipidemia Father    Diabetes Father    Stroke Maternal Grandmother    COPD Maternal Grandfather    Stroke Paternal Grandmother    Asthma Neg Hx    Atopy Neg Hx    Immunodeficiency Neg Hx    Urticaria Neg Hx    Angioedema Neg Hx     Social History   Socioeconomic History   Marital status: Single    Spouse name: Not on file   Number of children: Not on file   Years of education: Not on file   Highest education level: Not on file  Occupational History   Not on file  Tobacco Use   Smoking status: Never    Passive exposure: Never   Smokeless tobacco: Never  Vaping Use   Vaping Use: Never used  Substance and Sexual Activity   Alcohol use: Yes    Alcohol/week: 5.0 standard drinks of alcohol    Types: 5 Standard drinks or equivalent per week    Comment: occasionally   Drug use: No   Sexual activity: Yes  Other Topics Concern   Not on file  Social History Narrative   Not on file   Environmental and Social history  Lives in a house with a dry environment, no animals located inside the household, carpet in the bedroom, plastic on the bed, plastic on the pillow, and no smoking ongoing with inside the household.  She works as an  Event organiser.  Objective:   Vitals:   03/15/22 0850  BP: 134/82  Pulse: 80  Resp: 20  Temp: 98.1 F (36.7 C)  SpO2: 98%   Height: 5\' 3"  (160 cm) Weight: 143 lb (64.9 kg)  Physical Exam Constitutional:      Appearance: She is not diaphoretic.  HENT:     Head: Normocephalic.     Right Ear: Tympanic membrane, ear canal and external ear normal.     Left Ear: Tympanic membrane, ear canal and external ear normal.     Nose: Nose normal. No mucosal edema or rhinorrhea.     Mouth/Throat:     Pharynx: Uvula midline. No oropharyngeal exudate.  Eyes:     Conjunctiva/sclera: Conjunctivae normal.  Neck:     Thyroid: No thyromegaly.     Trachea: Trachea normal. No tracheal tenderness or tracheal deviation.  Cardiovascular:     Rate and Rhythm: Normal rate and regular rhythm.     Heart sounds: Normal heart sounds, S1 normal and S2 normal. No murmur heard. Pulmonary:     Effort: No respiratory distress.     Breath sounds: Normal breath sounds. No stridor. No wheezing or rales.  Lymphadenopathy:     Head:     Right side of head: No tonsillar adenopathy.     Left side of head: No tonsillar adenopathy.     Cervical: No cervical adenopathy.  Skin:    Findings: No erythema or rash.     Nails: There is no clubbing.  Neurological:     Mental Status: She is alert.     Diagnostics: Allergy skin tests were not performed.   Assessment and Plan:    1. Perennial allergic rhinitis   2. Seasonal allergic rhinitis due to pollen   3. Asthma, mild intermittent, well-controlled   4. Perennial allergic conjunctivitis of both eyes   5. Other atopic dermatitis   6. Contact dermatitis due  to chemicals     1.  Patch test today.  48-hour read on Wednesday  2.  Continue dupilumab injections every 2 weeks  3. Continue cetirizine + montelukast + topical scalp treatment  3. If needed:   A. Elidel - apply around eyes 1-2 times per day  B. Tobradex - 1 drop each eye 1-7 times per  week  C. Systane eye drops multiple times a day  D. AirSupra - 2 inhalations every 4-6 hours (coupon)  4. Never, ever rub eyes  5. Sports glasses while outdoors spring through fall  Ota definitely has an atopic immune system and this appears to have caused her significant problems in 2023 with evolution of skin inflammation and eye inflammation.  She is definitely doing a lot better now that she moved from 1 house into another house and started her dupilumab injections over the course of the past 6 weeks.  The question is what will happen as she moves through this upcoming springtime pollination season.  She may just do great with her current therapy or she may not be able to tolerate spring pollen exposure spring pollen exposure.  We will just need to go through that season to see what happens.  To be complete we will check a patch test to look for possible contact dermatitis contributing to her skin issues.  It is fine for her to use TobraDex as long as she uses it add very low-dose like once or twice or maybe 3 times a week.  I have given her a combination albuterol/budesonide inhaler to use as her rescue inhaler.   Jessica Priest, MD Allergy / Immunology [facility]

## 2022-03-15 NOTE — Patient Instructions (Addendum)
  1.  Patch test today.  48-hour read on Wednesday  2.  Continue dupilumab injections every 2 weeks  3. Continue cetirizine + montelukast + topical scalp treatment  3. If needed:   A. Elidel - apply around eyes 1-2 times per day  B. Tobradex - 1 drop each eye 1-7 times per week  C. Systane eye drops multiple times a day  D. AirSupra - 2 inhalations every 4-6 hours (coupon)  4. Never, ever rub eyes  5. Sports glasses while outdoors spring through fall

## 2022-03-16 ENCOUNTER — Encounter: Payer: Self-pay | Admitting: Allergy and Immunology

## 2022-03-17 ENCOUNTER — Ambulatory Visit: Payer: BC Managed Care – PPO | Admitting: Allergy and Immunology

## 2022-03-17 ENCOUNTER — Telehealth: Payer: Self-pay

## 2022-03-17 DIAGNOSIS — L253 Unspecified contact dermatitis due to other chemical products: Secondary | ICD-10-CM

## 2022-03-17 NOTE — Telephone Encounter (Signed)
Submitted prior authorization request on [email or web address] for pimecrolimus cream.  Awaiting reply.

## 2022-03-18 NOTE — Progress Notes (Signed)
Brenda Bonilla returns for a 48-hour check of her patch test.  She demonstrated +4 reaction to gold.  She will return for a 7-day reviewed.

## 2022-03-19 NOTE — Telephone Encounter (Signed)
[employer] called, request has been denied for pimecrolimus cream. Case #[id number]

## 2022-03-22 ENCOUNTER — Ambulatory Visit: Payer: PRIVATE HEALTH INSURANCE | Admitting: Allergy and Immunology

## 2022-03-22 DIAGNOSIS — L253 Unspecified contact dermatitis due to other chemical products: Secondary | ICD-10-CM

## 2022-03-22 MED ORDER — TACROLIMUS 0.1 % EX OINT: TOPICAL_OINTMENT | CUTANEOUS | 1 refills | Status: DC

## 2022-03-22 NOTE — Telephone Encounter (Signed)
Tacrolimus is preferred alternative.  Per Dr. Lucie Leather, ok to change to tacrolimus.  Patient informed.

## 2022-03-23 NOTE — Progress Notes (Signed)
Brenda Bonilla returns to this clinic for a 7-day patch read.  She still had a +4 reaction to gold but no other reactions have developed on her patch testing.  She is having some trouble obtaining picromolimus from her insurance company and we will order out tacrolimus and I gave her a sample of topical Jak inhibitor, Opzelura, to use until she can obtain that prescription.

## 2022-03-25 MED ORDER — DUPIXENT 300 MG/2 ML SUBCUTANEOUS SYRINGE
SUBCUTANEOUS | 11 refills | 28 days
Start: 2022-03-25 — End: ?

## 2022-03-26 NOTE — Unmapped (Signed)
[facility] Specialty Medication Onboarding    Specialty Medication: DUPIXENT SYRINGE 300 mg/2 mL Syrg injection (dupilumab)  Prior Authorization: Approved   Financial Assistance: Yes - copay card approved as secondary   Final Copay/Day Supply: $0 / 28    Insurance Restrictions: Yes - max 1 month supply     Notes to Pharmacist: n/a    The triage team has completed the benefits investigation and has determined that the patient is able to fill this medication at [facility]. Please contact the patient to complete the onboarding or follow up with the prescribing physician as needed.

## 2022-03-26 NOTE — Unmapped (Unsigned)
***INCOMPLETE*** - sent text but haven't called yet        [facility]   Patient Onboarding/Medication Counseling    Katherine Ball is a 46 y.o. female with atopic dermatitis who I am counseling today on {Blank:19197::initiation,continuation} of therapy.  I am speaking to {Blank:19197::the patient,the patient's caregiver, ***,the patient's family member, ***,***}.    Was a Nurse, learning disability used for this call? No    Verified patient's date of birth / HIPAA.    Specialty medication(s) to be sent: Inflammatory Disorders: Dupixent      Non-specialty medications/supplies to be sent: ***sharps?      Medications not needed at this time: n/a         Dupixent (dupilumab)    The patient declined counseling on {sscdeclinecounseling:66033} because {blank single:19197::they were counseled in clinic,they have taken the medication previously}. The information in the declined sections below are for informational purposes only and was not discussed with patient.       Medication & Administration     Dosage: Atopic dermatitis: Inject 600mg  under the skin as a loading dose followed by 300mg  every 14 days thereafter    Administration:     Dupixent Syringe  1. Gather all supplies needed for injection on a clean, flat working surface: medication syringe removed from packaging, alcohol swab, sharps container, etc.  2. Look at the medication label - look for correct medication, correct dose, and check the expiration date  3. Look at the medication - the liquid in the syringe should appear clear and colorless to pale yellow  4. Lay the syringe on a flat surface and allow it to warm up to room temperature for at least 45 minutes  5. Select injection site - you can use the front of your thigh or your belly (but not the area 2 inches around your belly button); if someone else is giving you the injection you can also use your upper arm in the skin covering your triceps muscle  6. Prepare injection site - wash your hands and clean the skin at the injection site with an alcohol swab and let it air dry, do not touch the injection site again before the injection  7. Hold the middle of the body of the syringe and gently pull the needle safety cap straight out. Be careful not to bend the needle. Do not remove until immediately prior to injection  8. Pinch the skin - with your hand not holding the syringe pinch up a fold of skin at the injection site using your forefinger and thumb  9. Insert the needle into the fold of skin at about a 45 degree angle - it's best to use a quick dart-like motion - with the syringe in position, release the pinch of skin and allow the skin to relax  10. Push the plunger down slowly as far as it will go until the syringe is empty, if the plunger is not fully depressed the needle shield will not extend to cover the needle when it is removed  11. Check that the syringe is empty and keep pressing down on the plunger while you pull the needle out at the same angle as inserted; after the needle is removed completely from the skin, release the plunger allowing the needle shield to activate and cover the used needle  12. Dispose of the used syringe immediately in your sharps disposal container  13. If you see any blood at the injection site, press a cotton ball or  gauze on the site and maintain pressure until the bleeding stops, do not rub the injection site    Adherence/Missed dose instructions:  If a dose is missed, administer within 7 days from the missed dose and then resume the original schedule. If the missed dose is not administered within 7 days, you can either wait until the next dose on the original schedule or take your dose now and resume every 14 days from the new injection date. Do not use 2 doses at the same time or extra doses.      Goals of Therapy     -Reduce symptoms of pruritus and dermatitis  -Prevent exacerbations  -Minimize therapeutic risks  -Avoidance of long-term systemic and topical glucocorticoid use  -Maintenance of effective psychosocial functioning    Side Effects & Monitoring Parameters     Injection site reaction (redness, irritation, inflammation localized to the site of administration)  Signs of a common cold - minor sore throat, runny or stuffy nose, etc.  Recurrence of cold sores (herpes simplex)      The following side effects should be reported to the provider:  Signs of a hypersensitivity reaction - rash; hives; itching; red, swollen, blistered, or peeling skin; wheezing; tightness in the chest or throat; difficulty breathing, swallowing, or talking; swelling of the mouth, face, lips, tongue, or throat; etc.  Eye pain or irritation or any visual disturbances  Shortness of breath or worsening of breathing      Contraindications, Warnings, & Precautions     Have your bloodwork checked as you have been told by your prescriber   Birth control pills and other hormone-based birth control may not work as well to prevent pregnancy  Talk with your doctor if you are pregnant, planning to become pregnant, or breastfeeding  Discuss the possible need for holding your dose(s) of Dupixent?? when a planned procedure is scheduled with the prescriber as it may delay healing/recovery timeline       Drug/Food Interactions     Medication list reviewed in Epic. The patient was instructed to inform the care team before taking any new medications or supplements. {Blank single:19197::No drug interactions identified,***}.   Talk with you prescriber or pharmacist before receiving any live vaccinations while taking this medication and after you stop taking it    Storage, Handling Precautions, & Disposal     Store this medication in the refrigerator.  Do not freeze  If needed, you may store at room temperature for up to 14 days  Store in original packaging, protected from light  Do not shake  Dispose of used syringes in a sharps disposal container            Current Medications (including OTC/herbals), Comorbidities and Allergies     Current Outpatient Medications   Medication Sig Dispense Refill    dupilumab (DUPIXENT SYRINGE) 300 mg/2 mL Syrg injection Inject the contents of 1 syringe (300 mg total) under the skin every fourteen (14) days. 4 mL 11     No current facility-administered medications for this visit.       Not on File    There is no problem list on file for this patient.      Reviewed and up to date in Epic.    Appropriateness of Therapy     Acute infections noted within Epic:  No active infections  Patient reported infection: {Blank single:19197::None,***- patient reported to provider,***- pharmacy reported to provider}    Is medication and dose appropriate based on  diagnosis and infection status? {Blank single:19197::Yes,No - evidence provided by prescriber in *** note}    Prescription has been clinically reviewed: {Blank single:19197::Yes,***}      Baseline Quality of Life Assessment      How many days over the past month did your atopic dermatitis  keep you from your normal activities? For example, brushing your teeth or getting up in the morning. {Blank:19197::***,0,Patient declined to answer}    Financial Information     Medication Assistance provided: Prior Authorization and Copay Assistance    Anticipated copay of $0 reviewed with patient. Verified delivery address.    Delivery Information     Scheduled delivery date: ***    Expected start date: ***    Medication will be delivered via {Blank:19197::UPS,Next Day Courier,Same Day Courier,Clinic Courier - *** clinic,***} to the {Blank:19197::prescription,temporary} address in Epic WAM.  This shipment {Blank single:19197::will,will not} require a signature.      Explained the services we provide at [facility] and that each month we would call to set up refills.  Stressed importance of returning phone calls so that we could ensure they receive their medications in time each month. Assistance provided: Prior Authorization and Copay Assistance    Anticipated copay of $0 reviewed with patient. Verified delivery address.    Delivery Information     Scheduled delivery date: 12/28    Expected start date: ASAP - past due by 1 week- will give on 12/28    Medication will be delivered via UPS to the prescription address in [facility].  This shipment will not require a signature.      Explained the services we provide at [facility] and that each month we would call to set up refills.  Stressed importance of returning phone calls so that we could ensure they receive their medications in time each month.  Informed patient that we should be setting up refills 7-10 days prior to when they will run out of medication.  A pharmacist will reach out to perform a clinical assessment periodically.  Informed patient that a welcome packet, containing information about our pharmacy and other support services, a Notice of Privacy Practices, and a drug information handout will be sent.      The patient or caregiver noted above participated in the development of this care plan and knows that they can request review of or adjustments to the care plan at any time.      Patient or caregiver verbalized understanding of the above information as well as how to contact the pharmacy at [phone] option 4 with any questions/concerns.  The pharmacy is open Monday through Friday 8:30am-4:30pm.  A pharmacist is available 24/7 via pager to answer any clinical questions they may have.    Patient Specific Needs     Does the patient have any physical, cognitive, or cultural barriers? No    Does the patient have adequate living arrangements? (i.e. the ability to store and take their medication appropriately) Yes    Did you identify any home environmental safety or security hazards? No    Patient prefers to have medications discussed with  Patient     Is the patient or caregiver able to read and understand currently experiencing any life circumstances that are negatively impacting your health and/or quality of life? {YES/NO/PATIENTDECLINED:93004}    Social Determinants of Health     Financial Resource Strain: Not on file   Internet Connectivity: Not on file   Food  Insecurity: Not on file   Tobacco Use: Not on file   Housing/Utilities: Not on file   Alcohol Use: Not on file   Transportation Needs: Not on file   Substance Use: Not on file   Health Literacy: Not on file   Physical Activity: Not on file   Interpersonal Safety: Not on file   Stress: Not on file   Intimate Partner Violence: Not on file   Depression: Not on file   Social Connections: Not on file       Would you be willing to receive help with any of the needs that you have identified today? {Yes/No/Not applicable:93005}       Teofilo Pod, PharmD  [facility] Specialty Pharmacist

## 2022-03-30 MED ORDER — EMPTY CONTAINER
3 refills | 0 days
Start: 2022-03-30 — End: ?

## 2022-03-31 MED FILL — DUPIXENT 300 MG/2 ML SUBCUTANEOUS SYRINGE: SUBCUTANEOUS | 28 days supply | Qty: 4 | Fill #0

## 2022-03-31 MED FILL — EMPTY CONTAINER: 120 days supply | Qty: 1 | Fill #0

## 2022-04-12 ENCOUNTER — Encounter: Payer: Self-pay | Admitting: *Deleted

## 2022-04-14 NOTE — Unmapped (Signed)
[facility] Specialty Pharmacy Refill Coordination Note    Katherine Ball, DOB: May 12, 1975  Phone: There are no phone numbers on file.      All above HIPAA information was verified with patient.         04/13/2022    10:46 PM   Specialty Rx Medication Refill Questionnaire   Which Medications would you like refilled and shipped? Dupixent   Please list all current allergies: None   Have you missed any doses in the last 30 days? No   Have you had any changes to your medication(s) since your last refill? Yes   Please list your medication(s) changes below. Cozaar 50mg    How many days remaining of each medication do you have at home? 1 dose   If receiving an injectable medication, next injection date is 04/16/2022   Have you experienced any side effects in the last 30 days? No   Please enter the full address (street address, city, state, zip code) where you would like your medication(s) to be delivered to. [address], [place], [place] [address]   Please specify on which day you would like your medication(s) to arrive. Note: if you need your medication(s) within 3 days, please call the pharmacy to schedule your order at [phone]  04/19/2022   Has your insurance changed since your last refill? No   Would you like a pharmacist to call you to discuss your medication(s)? No   Do you require a signature for your package? (Note: if we are billing Medicare Part B or your order contains a controlled substance, we will require a signature) No     **test claim for Dupixent is RTS until 1/17. Sent pt mychart to msg to confirm delivery of 1/18**    Completed refill call assessment today to schedule patient's medication shipment from the [facility] [phone]).  All relevant notes have been reviewed.       Confirmed patient received a Conservation officer, historic buildings and a Surveyor, mining with first shipment. The patient will receive a drug information handout for each medication shipped and additional FDA Medication Guides as required.         REFERRAL TO PHARMACIST     Referral to the pharmacist: Not needed      [facility]     Shipping address confirmed in Epic.     Delivery Scheduled: Yes, Expected medication delivery date: 04/22/22.     Medication will be delivered via UPS to the prescription address in Epic WAM.    Arnold Long, PharmD   [facility] Specialty Pharmacist

## 2022-04-21 MED FILL — DUPIXENT 300 MG/2 ML SUBCUTANEOUS SYRINGE: SUBCUTANEOUS | 28 days supply | Qty: 4 | Fill #1

## 2022-05-25 NOTE — Unmapped (Signed)
[facility] Specialty Pharmacy Refill Coordination Note    Specialty Medication(s) to be Shipped:   Inflammatory Disorders: Dupixent    Other medication(s) to be shipped: No additional medications requested for fill at this time     Katherine Ball, DOB: 04-13-1975  Phone: There are no phone numbers on file.      All above HIPAA information was verified with patient.     Was a Nurse, learning disability used for this call? No    Completed refill call assessment today to schedule patient's medication shipment from the [facility] [phone]).  All relevant notes have been reviewed.     Specialty medication(s) and dose(s) confirmed: Regimen is correct and unchanged.   Changes to medications: Katherine Ball reports no changes at this time.  Changes to insurance: No  New side effects reported not previously addressed with a pharmacist or physician: None reported  Questions for the pharmacist: No    Confirmed patient received a Conservation officer, historic buildings and a Surveyor, mining with first shipment. The patient will receive a drug information handout for each medication shipped and additional FDA Medication Guides as required.       DISEASE/MEDICATION-SPECIFIC INFORMATION        For patients on injectable medications: Patient currently has 0 doses left.  Next injection is scheduled for 05/28/22.    SPECIALTY MEDICATION ADHERENCE     Medication Adherence    Patient reported X missed doses in the last month: 0  Specialty Medication: DUPIXENT SYRINGE 300 mg/2 mL Syrg injection (dupilumab)  Patient is on additional specialty medications: No              Were doses missed due to medication being on hold? No      REFERRAL TO PHARMACIST     Referral to the pharmacist: Not needed      [facility]     Shipping address confirmed in Epic.     Patient was notified of new phone menu : No    Delivery Scheduled: Yes, Expected medication delivery date: 05/27/22.     Medication will be delivered via UPS to the prescription address in Epic WAM.    Katherine Ball   [facility] Specialty Technician

## 2022-05-26 MED FILL — DUPIXENT 300 MG/2 ML SUBCUTANEOUS SYRINGE: SUBCUTANEOUS | 28 days supply | Qty: 4 | Fill #2

## 2022-06-23 NOTE — Unmapped (Signed)
[facility] Specialty Pharmacy Refill Coordination Note    Specialty Medication(s) to be Shipped:   Inflammatory Disorders: Dupixent    Other medication(s) to be shipped: No additional medications requested for fill at this time     Katherine Ball, DOB: 1975/06/26  Phone: There are no phone numbers on file.      All above HIPAA information was verified with patient.     Was a Nurse, learning disability used for this call? No    Completed refill call assessment today to schedule patient's medication shipment from the [facility] [phone]).  All relevant notes have been reviewed.     Specialty medication(s) and dose(s) confirmed: Regimen is correct and unchanged.   Changes to medications: Katherine Ball reports no changes at this time.  Changes to insurance: No  New side effects reported not previously addressed with a pharmacist or physician: None reported  Questions for the pharmacist: No    Confirmed patient received a Conservation officer, historic buildings and a Surveyor, mining with first shipment. The patient will receive a drug information handout for each medication shipped and additional FDA Medication Guides as required.       DISEASE/MEDICATION-SPECIFIC INFORMATION        For patients on injectable medications: Patient currently has 0 doses left.  Next injection is scheduled for 3/22.    SPECIALTY MEDICATION ADHERENCE     Medication Adherence    Specialty Medication: DUPIXENT SYRINGE 300 mg/2 mL  Patient is on additional specialty medications: No              Were doses missed due to medication being on hold? No    Dupixent 300/2 mg/ml: 0 days of medicine on hand       REFERRAL TO PHARMACIST     Referral to the pharmacist: Not needed      [facility]     Shipping address confirmed in Epic.     Patient was notified of new phone menu : No    Delivery Scheduled: Yes, Expected medication delivery date: 3/22.     Medication will be delivered via UPS to the prescription address in Epic WAM.    Willette Pa   [facility] Specialty Technician

## 2022-06-24 MED FILL — DUPIXENT 300 MG/2 ML SUBCUTANEOUS SYRINGE: SUBCUTANEOUS | 28 days supply | Qty: 4 | Fill #3

## 2022-07-20 NOTE — Unmapped (Signed)
[facility] Specialty Pharmacy Refill Coordination Note    Katherine Ball, DOB: [birth year]  Phone: There are no phone numbers on file.      All above HIPAA information was verified with patient.         07/19/2022    10:41 PM   Specialty Rx Medication Refill Questionnaire   Which Medications would you like refilled and shipped? Dupixent   Please list all current allergies: None   Have you missed any doses in the last 30 days? No   Have you had any changes to your medication(s) since your last refill? No   How many days remaining of each medication do you have at home? 0   If receiving an injectable medication, next injection date is 07/23/2022   Have you experienced any side effects in the last 30 days? No   Please enter the full address (street address, city, state, zip code) where you would like your medication(s) to be delivered to. [address], [place], [place] [address]   Please specify on which day you would like your medication(s) to arrive. Note: if you need your medication(s) within 3 days, please call the pharmacy to schedule your order at [phone]  07/22/2022   Has your insurance changed since your last refill? No   Would you like a pharmacist to call you to discuss your medication(s)? No   Do you require a signature for your package? (Note: if we are billing Medicare Part B or your order contains a controlled substance, we will require a signature) No         Completed refill call assessment today to schedule patient's medication shipment from the [facility] [phone]).  All relevant notes have been reviewed.       Confirmed patient received a Conservation officer, historic buildings and a Surveyor, mining with first shipment. The patient will receive a drug information handout for each medication shipped and additional FDA Medication Guides as required.         REFERRAL TO PHARMACIST     Referral to the pharmacist: Not needed      [facility]     Shipping address confirmed in Epic.     Delivery Scheduled: Yes, Expected medication delivery date: 07/22/22.     Medication will be delivered via UPS to the prescription address in Epic WAM.    Willette Pa   [facility] Specialty Technician

## 2022-07-21 MED FILL — DUPIXENT 300 MG/2 ML SUBCUTANEOUS SYRINGE: SUBCUTANEOUS | 28 days supply | Qty: 4 | Fill #4

## 2022-08-14 ENCOUNTER — Other Ambulatory Visit: Payer: Self-pay | Admitting: Allergy and Immunology

## 2022-08-18 NOTE — Unmapped (Signed)
[facility] Specialty Pharmacy Refill Coordination Note    Specialty Medication(s) to be Shipped:   Inflammatory Disorders: Dupixent    Other medication(s) to be shipped: No additional medications requested for fill at this time     Katherine Ball, DOB: November 11, 1975  Phone: There are no phone numbers on file.      All above HIPAA information was verified with patient.     Was a Nurse, learning disability used for this call? No    Completed refill call assessment today to schedule patient's medication shipment from the [facility] [phone]).  All relevant notes have been reviewed.     Specialty medication(s) and dose(s) confirmed: Regimen is correct and unchanged.   Changes to medications: Katherine Ball reports no changes at this time.  Changes to insurance: No  New side effects reported not previously addressed with a pharmacist or physician: None reported  Questions for the pharmacist: No    Confirmed patient received a Conservation officer, historic buildings and a Surveyor, mining with first shipment. The patient will receive a drug information handout for each medication shipped and additional FDA Medication Guides as required.       DISEASE/MEDICATION-SPECIFIC INFORMATION        For patients on injectable medications: Patient currently has 0 doses left.  Next injection is scheduled for 5/17.    SPECIALTY MEDICATION ADHERENCE     Medication Adherence    Patient reported X missed doses in the last month: 0  Specialty Medication: DUPIXENT SYRINGE 300 mg/2 mL Syrg injection (dupilumab)  Patient is on additional specialty medications: No              Were doses missed due to medication being on hold? No    Dupixent 300/2 mg/ml: 2 days of medicine on hand       REFERRAL TO PHARMACIST     Referral to the pharmacist: Not needed      [facility]     Shipping address confirmed in Epic.     Patient was notified of new phone menu : No    Delivery Scheduled: Yes, Expected medication delivery date: 5/17.     Medication will be delivered via UPS to the prescription address in Epic WAM.    Katherine Ball Shared [facility] Specialty Technician

## 2022-08-19 MED FILL — DUPIXENT 300 MG/2 ML SUBCUTANEOUS SYRINGE: SUBCUTANEOUS | 28 days supply | Qty: 4 | Fill #5

## 2022-09-15 NOTE — Unmapped (Signed)
[facility] Specialty Pharmacy Refill Coordination Note    Specialty Medication(s) to be Shipped:   Inflammatory Disorders: Dupixent    Other medication(s) to be shipped: No additional medications requested for fill at this time     Katherine Ball, DOB: 05-10-1975  Phone: There are no phone numbers on file.      All above HIPAA information was verified with patient.     Was a Nurse, learning disability used for this call? No    Completed refill call assessment today to schedule patient's medication shipment from the [facility] [phone]).  All relevant notes have been reviewed.     Specialty medication(s) and dose(s) confirmed: Regimen is correct and unchanged.   Changes to medications: Katherine Ball reports no changes at this time.  Changes to insurance: No  New side effects reported not previously addressed with a pharmacist or physician: None reported  Questions for the pharmacist: No    Confirmed patient received a Conservation officer, historic buildings and a Surveyor, mining with first shipment. The patient will receive a drug information handout for each medication shipped and additional FDA Medication Guides as required.       DISEASE/MEDICATION-SPECIFIC INFORMATION        For patients on injectable medications: Patient currently has 0 doses left.  Next injection is scheduled for 6/14.    SPECIALTY MEDICATION ADHERENCE     Medication Adherence    Patient reported X missed doses in the last month: 0  Specialty Medication: DUPIXENT SYRINGE 300 mg/2 mL Syrg injection (dupilumab)  Patient is on additional specialty medications: No              Were doses missed due to medication being on hold? No    Dupixent 300/2 mg/ml: 2 days of medicine on hand       REFERRAL TO PHARMACIST     Referral to the pharmacist: Not needed      [facility]     Shipping address confirmed in Epic.     Patient was notified of new phone menu : No    Delivery Scheduled: Yes, Expected medication delivery date: 6/14.     Medication will be delivered via UPS to the prescription address in Epic WAM.    Katherine Ball Shared [facility] Specialty Technician

## 2022-09-16 MED FILL — DUPIXENT 300 MG/2 ML SUBCUTANEOUS SYRINGE: SUBCUTANEOUS | 28 days supply | Qty: 4 | Fill #6

## 2022-10-08 NOTE — Unmapped (Signed)
[facility] Specialty Pharmacy Refill Coordination Note    Specialty Medication(s) to be Shipped:   Inflammatory Disorders: Dupixent    Other medication(s) to be shipped:  N/A     Katherine Ball, DOB: 1975/11/18  Phone: There are no phone numbers on file.      All above HIPAA information was verified with patient.     Was a Nurse, learning disability used for this call? No    Completed refill call assessment today to schedule patient's medication shipment from the [facility] [phone]).  All relevant notes have been reviewed.     Specialty medication(s) and dose(s) confirmed: Regimen is correct and unchanged.   Changes to medications: [name] reports no changes at this time.  Changes to insurance: No  New side effects reported not previously addressed with a pharmacist or physician: None reported  Questions for the pharmacist: No    Confirmed patient received a Conservation officer, historic buildings and a Surveyor, mining with first shipment. The patient will receive a drug information handout for each medication shipped and additional FDA Medication Guides as required.       DISEASE/MEDICATION-SPECIFIC INFORMATION        For patients on injectable medications: Patient currently has 0 doses left.  Next injection is scheduled for 10/15/2022.    SPECIALTY MEDICATION ADHERENCE     Medication Adherence    Patient reported X missed doses in the last month: 0  Specialty Medication: DUPIXENT SYRINGE 300 mg/2 mL Syrg injection (dupilumab)              Were doses missed due to medication being on hold? No      dupilumab: DUPIXENT SYRINGE 300 mg/2 mL Syrg injection: 0 days of medicine on hand       REFERRAL TO PHARMACIST     Referral to the pharmacist: Not needed      [facility]     Shipping address confirmed in Epic.       Delivery Scheduled: Yes, Expected medication delivery date: 10/14/2022.     Medication will be delivered via UPS to the prescription address in Epic WAM.    Craige Cotta   [facility] Shared [facility] Specialty Technician

## 2022-10-13 MED FILL — DUPIXENT 300 MG/2 ML SUBCUTANEOUS SYRINGE: SUBCUTANEOUS | 28 days supply | Qty: 4 | Fill #7

## 2022-11-08 NOTE — Unmapped (Signed)
[facility] Specialty Pharmacy Refill Coordination Note    Katherine Ball, DOB: Aug 18, 1975  Phone: There are no phone numbers on file.      All above HIPAA information was verified with patient.         11/05/2022    10:39 PM   Specialty Rx Medication Refill Questionnaire   Which Medications would you like refilled and shipped? Emgality   Please list all current allergies: Gold   Have you missed any doses in the last 30 days? No   Have you had any changes to your medication(s) since your last refill? No   How many days remaining of each medication do you have at home? 0   If receiving an injectable medication, next injection date is 11/12/2022   Have you experienced any side effects in the last 30 days? No   Please enter the full address (street address, city, state, zip code) where you would like your medication(s) to be delivered to. [address], [address], [place], [place] [address]   Please specify on which day you would like your medication(s) to arrive. Note: if you need your medication(s) within 3 days, please call the pharmacy to schedule your order at [phone]  11/12/2022   Has your insurance changed since your last refill? No   Would you like a pharmacist to call you to discuss your medication(s)? No   Do you require a signature for your package? (Note: if we are billing Medicare Part B or your order contains a controlled substance, we will require a signature) No         Completed refill call assessment today to schedule patient's medication shipment from the [facility] [phone]).  All relevant notes have been reviewed.       Confirmed patient received a Conservation officer, historic buildings and a Surveyor, mining with first shipment. The patient will receive a drug information handout for each medication shipped and additional FDA Medication Guides as required.         REFERRAL TO PHARMACIST     Referral to the pharmacist: Not needed      [facility]     Shipping address confirmed in Epic.     Delivery Scheduled: Yes, Expected medication delivery date: 8/9.     Medication will be delivered via UPS to the prescription address in Epic WAM.    [name] Shared [facility] Specialty Technician

## 2022-11-11 MED FILL — DUPIXENT 300 MG/2 ML SUBCUTANEOUS SYRINGE: SUBCUTANEOUS | 28 days supply | Qty: 4 | Fill #8

## 2022-12-03 NOTE — Unmapped (Signed)
[facility] Specialty Pharmacy Refill Coordination Note    CANICE BALTHAZAR, DOB: 1975/07/15  Phone: There are no phone numbers on file.      All above HIPAA information was verified with patient.         12/03/2022     1:10 PM   Specialty Rx Medication Refill Questionnaire   Which Medications would you like refilled and shipped? Dupixent   Please list all current allergies: None   Have you missed any doses in the last 30 days? No   Have you had any changes to your medication(s) since your last refill? No   How many days remaining of each medication do you have at home? 0   If receiving an injectable medication, next injection date is 12/10/2022   Have you experienced any side effects in the last 30 days? No   Please enter the full address (street address, city, state, zip code) where you would like your medication(s) to be delivered to. [address], [address], [place], [place] [address]   Please specify on which day you would like your medication(s) to arrive. Note: if you need your medication(s) within 3 days, please call the pharmacy to schedule your order at [phone]  12/09/2022   Has your insurance changed since your last refill? No   Would you like a pharmacist to call you to discuss your medication(s)? No   Do you require a signature for your package? (Note: if we are billing Medicare Part B or your order contains a controlled substance, we will require a signature) No         Completed refill call assessment today to schedule patient's medication shipment from the [facility] [phone]).  All relevant notes have been reviewed.       Confirmed patient received a Conservation officer, historic buildings and a Surveyor, mining with first shipment. The patient will receive a drug information handout for each medication shipped and additional FDA Medication Guides as required.         REFERRAL TO PHARMACIST     Referral to the pharmacist: Not needed      [facility]     Shipping address confirmed in Epic.     Delivery Scheduled: Yes, Expected medication delivery date: 9/5.     Medication will be delivered via UPS to the prescription address in Epic WAM.    [name] Shared [facility] Specialty Technician

## 2022-12-08 MED FILL — DUPIXENT 300 MG/2 ML SUBCUTANEOUS SYRINGE: SUBCUTANEOUS | 28 days supply | Qty: 4 | Fill #9

## 2022-12-30 NOTE — Unmapped (Signed)
Patient was notified of operational disruptions. Patient opted to: {Blank:19197::schedule their refill with understanding of potential delay until 10/1 or later.,transfer to another pharmacy. This was facilitated by pharmacy staff     [facility] Specialty and Home Delivery Pharmacy Refill Coordination Note    Katherine Ball, DOB: October 02, 1975  Phone: There are no phone numbers on file.      All above HIPAA information was verified with patient.         12/29/2022     8:43 PM   Specialty Rx Medication Refill Questionnaire   Which Medications would you like refilled and shipped? Dupixent   Please list all current allergies: None   Have you missed any doses in the last 30 days? No   Have you had any changes to your medication(s) since your last refill? No   How many days remaining of each medication do you have at home? 0   If receiving an injectable medication, next injection date is 01/07/2023   Have you experienced any side effects in the last 30 days? No   Please enter the full address (street address, city, state, zip code) where you would like your medication(s) to be delivered to. [address], [place], [place] [address]   Please specify on which day you would like your medication(s) to arrive. Note: if you need your medication(s) within 3 days, please call the pharmacy to schedule your order at [phone]  01/06/2023   Has your insurance changed since your last refill? No   Would you like a pharmacist to call you to discuss your medication(s)? No   Do you require a signature for your package? (Note: if we are billing Medicare Part B or your order contains a controlled substance, we will require a signature) No         Completed refill call assessment today to schedule patient's medication shipment from the [facility] Pharm[facility] [phone]).  All relevant notes have been reviewed.       Confirmed patient received a Conservation officer, historic buildings and a Surveyor, mining with first shipment. The patient will receive a drug information handout for each medication shipped and additional FDA Medication Guides as required.         REFERRAL TO PHARMACIST     Referral to the pharmacist: Not needed      [facility]     Shipping address confirmed in Epic.     Delivery Scheduled: Yes, Expected medication delivery date: 10/03.     Medication will be delivered via UPS to the prescription address in Epic WAM.    [name] [facility] Pharmacy Specialty Technician

## 2023-01-05 MED FILL — DUPIXENT 300 MG/2 ML SUBCUTANEOUS SYRINGE: SUBCUTANEOUS | 28 days supply | Qty: 4 | Fill #10

## 2023-01-25 NOTE — Unmapped (Signed)
[facility] Specialty and Home Delivery Pharmacy Refill Coordination Note    Katherine Ball, DOB: 06-15-1975  Phone: There are no phone numbers on file.      All above HIPAA information was verified with patient.         01/24/2023    10:09 PM   Specialty Rx Medication Refill Questionnaire   Which Medications would you like refilled and shipped? Dupixent   Please list all current allergies: None   Have you missed any doses in the last 30 days? No   Have you had any changes to your medication(s) since your last refill? No   How many days remaining of each medication do you have at home? 0   If receiving an injectable medication, next injection date is 02/04/2023   Have you experienced any side effects in the last 30 days? No   Please enter the full address (street address, city, state, zip code) where you would like your medication(s) to be delivered to. [address], [address], [place], [place] [address]   Please specify on which day you would like your medication(s) to arrive. Note: if you need your medication(s) within 3 days, please call the pharmacy to schedule your order at [phone]  02/03/2023   Has your insurance changed since your last refill? No   Would you like a pharmacist to call you to discuss your medication(s)? No   Do you require a signature for your package? (Note: if we are billing Medicare Part B or your order contains a controlled substance, we will require a signature) No         Completed refill call assessment today to schedule patient's medication shipment from the [facility] Delivery Pharm[facility] [phone]).  All relevant notes have been reviewed.       Confirmed patient received a Conservation officer, historic buildings and a Surveyor, mining with first shipment. The patient will receive a drug information handout for each medication shipped and additional FDA Medication Guides as required.         REFERRAL TO PHARMACIST     Referral to the pharmacist: Not needed      [facility]     Shipping address confirmed in Epic.     Delivery Scheduled: Yes, Expected medication delivery date: 10/31.     Medication will be delivered via UPS to the prescription address in Epic WAM.    [name] Specialty Technician

## 2023-02-02 MED FILL — DUPIXENT 300 MG/2 ML SUBCUTANEOUS SYRINGE: SUBCUTANEOUS | 28 days supply | Qty: 4 | Fill #11

## 2023-02-17 MED ORDER — DUPIXENT 300 MG/2 ML SUBCUTANEOUS SYRINGE
SUBCUTANEOUS | 11 refills | 28 days | Status: CN
Start: 2023-02-17 — End: ?

## 2023-02-21 MED ORDER — DUPIXENT 300 MG/2 ML SUBCUTANEOUS SYRINGE
SUBCUTANEOUS | 11 refills | 28.00000 days | Status: CN
Start: 2023-02-21 — End: ?

## 2023-02-22 NOTE — Unmapped (Signed)
[facility] Specialty and Home Delivery Pharmacy Refill Coordination Note    Katherine Ball, DOB: 11-09-1975  Phone: There are no phone numbers on file.      All above HIPAA information was verified with patient.         02/21/2023    10:46 PM   Specialty Rx Medication Refill Questionnaire   Which Medications would you like refilled and shipped? Dupixent   Please list all current allergies: None   Have you missed any doses in the last 30 days? No   Have you had any changes to your medication(s) since your last refill? No   How many days remaining of each medication do you have at home? 0   If receiving an injectable medication, next injection date is 03/04/2023   Have you experienced any side effects in the last 30 days? No   Please enter the full address (street address, city, state, zip code) where you would like your medication(s) to be delivered to. [address], [address], [place], [place] [address]   Please specify on which day you would like your medication(s) to arrive. Note: if you need your medication(s) within 3 days, please call the pharmacy to schedule your order at [phone]  03/01/2023   Has your insurance changed since your last refill? No   Would you like a pharmacist to call you to discuss your medication(s)? No   Do you require a signature for your package? (Note: if we are billing Medicare Part B or your order contains a controlled substance, we will require a signature) No         Completed refill call assessment today to schedule patient's medication shipment from the [facility] Delivery Pharm[facility] [phone]).  All relevant notes have been reviewed.       Confirmed patient received a Conservation officer, historic buildings and a Surveyor, mining with first shipment. The patient will receive a drug information handout for each medication shipped and additional FDA Medication Guides as required.         REFERRAL TO PHARMACIST     Referral to the pharmacist: Not needed      [facility]     Shipping address confirmed in Epic.     Delivery Scheduled: Yes, Expected medication delivery date: 11/26.     Medication will be delivered via UPS to the prescription address in Epic WAM.    [name] Specialty Technician

## 2023-02-23 MED ORDER — DUPIXENT 300 MG/2 ML SUBCUTANEOUS SYRINGE
SUBCUTANEOUS | 11 refills | 28 days
Start: 2023-02-23 — End: ?

## 2023-02-28 MED FILL — DUPIXENT 300 MG/2 ML SUBCUTANEOUS SYRINGE: SUBCUTANEOUS | 28 days supply | Qty: 4 | Fill #0

## 2023-03-23 ENCOUNTER — Ambulatory Visit: Payer: BC Managed Care – PPO | Admitting: Allergy and Immunology

## 2023-03-23 DIAGNOSIS — J309 Allergic rhinitis, unspecified: Secondary | ICD-10-CM

## 2023-03-28 NOTE — Unmapped (Signed)
[facility] Delivery Pharm[facility] Clinical Assessment & Refill Coordination Note    Patient reports doing well on the medication and did not have any questions or concerns at this time.     Katherine Ball, DOB: 1975/09/29  Phone: There are no phone numbers on file.    All above HIPAA information was verified with patient.     Was a Nurse, learning disability used for this call? No    Specialty Medication(s):   Inflammatory Disorders: Dupixent     Current Outpatient Medications   Medication Sig Dispense Refill    cetirizine (ZYRTEC) 10 MG tablet Take 1 tablet (10 mg total) by mouth daily.      dicyclomine (BENTYL) 20 mg tablet Take 1 tablet (20 mg total) by mouth every six (6) hours.      dupilumab (DUPIXENT SYRINGE) 300 mg/2 mL Syrg injection Inject the contents of 1 syringe (300 mg total) under the skin every fourteen (14) days. 4 mL 11    empty container Misc Use as directed to dispose of Dupixent 1 each 3    galcanezumab-gnlm (EMGALITY SYRINGE) 120 mg/mL Syrg Inject 120 mg under the skin every twenty-eight (28) days.      levothyroxine (SYNTHROID) 88 MCG tablet Take 1 tablet (88 mcg total) by mouth daily.      montelukast (SINGULAIR) 10 mg tablet Take 1 tablet (10 mg total) by mouth nightly.      nortriptyline (PAMELOR) 10 MG capsule Take 1 capsule (10 mg total) by mouth nightly.      omeprazole (PRILOSEC) 40 MG capsule Take 1 capsule (40 mg total) by mouth daily.      sertraline (ZOLOFT) 100 MG tablet Take 1 tablet (100 mg total) by mouth daily.       No current facility-administered medications for this visit.        Changes to medications: [place] reports no changes at this time.    No Known Allergies    Changes to allergies: No    SPECIALTY MEDICATION ADHERENCE     Dupixent 300 mg/ml: 0 doses of medicine on hand     Medication Adherence    Patient reported X missed doses in the last month: 0  Specialty Medication: dupilumab (DUPIXENT SYRINGE) 300 mg/2 mL Syrg injection  Patient is on additional specialty medications: No  Patient is on more than two specialty medications: No  Informant: patient          Specialty medication(s) dose(s) confirmed: Regimen is correct and unchanged.     Are there any concerns with adherence? No    Adherence counseling provided? Not needed    CLINICAL MANAGEMENT AND INTERVENTION      Clinical Benefit Assessment:    Do you feel the medicine is effective or helping your condition? Yes    Clinical Benefit counseling provided? Not needed    Adverse Effects Assessment:    Are you experiencing any side effects? No    Are you experiencing difficulty administering your medicine? No    Quality of Life Assessment:    Quality of Life    Rheumatology  Oncology  Dermatology  1. What impact has your specialty medication had on the symptoms of your skin condition (i.e. itchiness, soreness, stinging)?: Tremendous  2. What impact has your specialty medication had on your comfort level with your skin?: Tremendous  Cystic Fibrosis          How many days over the past month did your AD  keep you from your  normal activities? For example, brushing your teeth or getting up in the morning. 0    Have you discussed this with your provider? Not needed    Acute Infection Status:    Acute infections noted within Epic:  No active infections  Patient reported infection: None    Therapy Appropriateness:    Is therapy appropriate based on current medication list, adverse reactions, adherence, clinical benefit and progress toward achieving therapeutic goals? Yes, therapy is appropriate and should be continued     DISEASE/MEDICATION-SPECIFIC INFORMATION      For patients on injectable medications: Patient currently has 0 doses left.  Next injection is scheduled for 04/01/23.    Chronic Inflammatory Diseases: Have you experienced any flares in the last month? No    PATIENT SPECIFIC NEEDS     Does the patient have any physical, cognitive, or cultural barriers? No    Is the patient high risk? No    Did the patient require a clinical intervention? No    Does the patient require physician intervention or other additional services (i.e., nutrition, smoking cessation, social work)? No    SOCIAL DETERMINANTS OF HEALTH     At the [facility], we have learned that life circumstances - like trouble affording food, housing, utilities, or transportation can affect the health of many of our patients.   That is why we wanted to ask: are you currently experiencing any life circumstances that are negatively impacting your health and/or quality of life? No    Social Drivers of Engineer, water Insecurity: Not on file   Internet Connectivity: Not on file   Housing/Utilities: Not on file   Tobacco Use: Not on file   Transportation Needs: Not on file   Alcohol Use: Not on file   Interpersonal Safety: Not on file   Physical Activity: Not on file   Intimate Partner Violence: Not on file   Stress: Not on file   Substance Use: Not on file (03/28/2023)   Social Connections: Not on file   Financial Resource Strain: Not on file   Depression: Not on file   Health Literacy: Not on file       Would you be willing to receive help with any of the needs that you have identified today? Not applicable       SHIPPING     Specialty Medication(s) to be Shipped:   Inflammatory Disorders: Dupixent    Other medication(s) to be shipped: No additional medications requested for fill at this time     Changes to insurance: No    Delivery Scheduled: Yes, Expected medication delivery date: 04/01/23.     Medication will be delivered via UPS to the confirmed prescription address in [facility].    The patient will receive a drug information handout for each medication shipped and additional FDA Medication Guides as required.  Verified that patient has previously received a Conservation officer, historic buildings and a Surveyor, mining.    The patient or caregiver noted above participated in the development of this care plan and knows that they can request review of or adjustments to the care plan at any time.      All of the patient's questions and concerns have been addressed.    Sherral Hammers, PharmD   [facility] Special[facility] and Home Delivery Pharmacy Specialty Pharmacist

## 2023-03-31 MED FILL — DUPIXENT 300 MG/2 ML SUBCUTANEOUS SYRINGE: SUBCUTANEOUS | 28 days supply | Qty: 4 | Fill #1

## 2023-04-22 DIAGNOSIS — L209 Atopic dermatitis, unspecified: Principal | ICD-10-CM

## 2023-04-22 NOTE — Unmapped (Signed)
[facility] Specialty and Home Delivery Pharmacy Refill Coordination Note    Katherine Ball, DOB: [birth year]  Phone: There are no phone numbers on file.      All above HIPAA information was verified with patient.         04/21/2023     9:52 PM   Specialty Rx Medication Refill Questionnaire   Which Medications would you like refilled and shipped? Dupixent   Please list all current allergies: None   Have you missed any doses in the last 30 days? No   Have you had any changes to your medication(s) since your last refill? No   How many days remaining of each medication do you have at home? 0   If receiving an injectable medication, next injection date is 04/29/2023   Have you experienced any side effects in the last 30 days? No   Please enter the full address (street address, city, state, zip code) where you would like your medication(s) to be delivered to. [address], [name], [place] [address]   Please specify on which day you would like your medication(s) to arrive. Note: if you need your medication(s) within 3 days, please call the pharmacy to schedule your order at [phone]  04/28/2026   Has your insurance changed since your last refill? Yes   Would you like a pharmacist to call you to discuss your medication(s)? No   Do you require a signature for your package? (Note: if we are billing Medicare Part B or your order contains a controlled substance, we will require a signature) No         Completed refill call assessment today to schedule patient's medication shipment from the [facility] Special[facility] and Home Delivery Pharmacy [phone]).  All relevant notes have been reviewed.       Confirmed patient received a Conservation officer, historic buildings and a Surveyor, mining with first shipment. The patient will receive a drug information handout for each medication shipped and additional FDA Medication Guides as required.         REFERRAL TO PHARMACIST     Referral to the pharmacist: Not needed      [facility]     Shipping address confirmed in Epic.     Delivery Scheduled: Yes, Expected medication delivery date: 04/29/23.     Medication will be delivered via UPS to the prescription address in Epic WAM.    Katherine Ball Special[facility] and Home Delivery Pharmacy Specialty Technician

## 2023-04-28 ENCOUNTER — Ambulatory Visit (INDEPENDENT_AMBULATORY_CARE_PROVIDER_SITE_OTHER): Payer: No Typology Code available for payment source | Admitting: Allergy and Immunology

## 2023-04-28 ENCOUNTER — Encounter: Payer: Self-pay | Admitting: Allergy and Immunology

## 2023-04-28 VITALS — BP 118/82 | HR 78 | Resp 14 | Ht 62.4 in | Wt 143.6 lb

## 2023-04-28 DIAGNOSIS — J301 Allergic rhinitis due to pollen: Secondary | ICD-10-CM | POA: Diagnosis not present

## 2023-04-28 DIAGNOSIS — L2089 Other atopic dermatitis: Secondary | ICD-10-CM | POA: Diagnosis not present

## 2023-04-28 DIAGNOSIS — H1013 Acute atopic conjunctivitis, bilateral: Secondary | ICD-10-CM | POA: Diagnosis not present

## 2023-04-28 DIAGNOSIS — J4520 Mild intermittent asthma, uncomplicated: Secondary | ICD-10-CM

## 2023-04-28 DIAGNOSIS — J3089 Other allergic rhinitis: Secondary | ICD-10-CM

## 2023-04-28 MED ORDER — FLUOROMETHOLONE 0.1 % OP SUSP: OPHTHALMIC | 11 refills | Status: DC

## 2023-04-28 MED ORDER — OPZELURA 1.5 % EX CREA: TOPICAL_CREAM | CUTANEOUS | 11 refills | Status: AC

## 2023-04-28 MED ORDER — LOTEPREDNOL ETABONATE 0.2 % OP SUSP: OPHTHALMIC | 11 refills | Status: DC

## 2023-04-28 NOTE — Progress Notes (Signed)
[place] - [place] - [place] - [place] - [place]   Follow-up Note  Referring Provider: Rhea Bleacher* Primary Provider: Krystal Clark, NP Date of Office Visit: 04/28/2023  Subjective:   Brenda Bonilla (DOB: 1975-12-25) is a 48 y.o. female who [facility]/2023 in re-evaluation of the following:  HPI: Miguelina presents to this clinic in evaluation of atopic dermatitis and blepharoconjunctivitis.  She continues on dupilumab which has really resulted in very good control of her atopic dermatitis and she rarely uses any topical agents at this point in time.  However, she has had continued inflammation of her eyelids and her eyes water all the time and sometimes she has crusty and mucoid drainage especially upon wakening in the morning.  She has responded to topical Jak inhibitor in the past but her insurance company denied the use of this agent even though she has failed both a calcineurin inhibitor and a topical steroid.  And she uses TobraDex about 3 times per week which also helps this issue.  She tries as hard as she cannot to rub her eyes.  Her airway issue has basically melted away and she rarely uses a short acting bronchodilator.  She has not required a systemic steroid or antibiotic for any type of airway issue.  She has obtained a flu vaccine and the COVID-vaccine.  Allergies as of 04/28/2023   No Known Allergies      Medication List    Airsupra 90-80 MCG/ACT Aero Generic drug: Albuterol-Budesonide Inhale 2 puffs into the lungs every 4 (four) hours as needed.   aspirin 325 MG tablet Take 325 mg by mouth daily.   cetirizine 10 MG tablet Commonly known as: ZYRTEC Take 10 mg by mouth daily.   clobetasol 0.05 % external solution Commonly known as: TEMOVATE apply to scalp TWICE DAILY for 1-2 weeks AS NEEDED when flaring   dicyclomine 10 MG capsule Commonly known as: BENTYL Take 1 capsule (10 mg total)  by mouth 2 (two) times daily at 8 am and 10 pm.   Dupixent 200 MG/1. Soaj Generic drug: Dupilumab   Emgality 120 MG/ML Soaj Generic drug: Galcanezumab-gnlm Inject 1 mL into the skin every 30 (thirty) days.   ketoconazole 2 % shampoo Commonly known as: NIZORAL apply to scalp EVERY OTHER DAY   levothyroxine 88 MCG tablet Commonly known as: SYNTHROID Take 88 mcg by mouth daily before breakfast.   montelukast 10 MG tablet Commonly known as: SINGULAIR Take 10 mg by mouth daily.   nortriptyline 25 MG capsule Commonly known as: PAMELOR Take 25 mg by mouth at bedtime.   omeprazole 20 MG capsule Commonly known as: PRILOSEC Take 20 mg by mouth daily as needed.   pravastatin 20 MG tablet Commonly known as: PRAVACHOL Take 20 mg by mouth daily.   Roflumilast 0.3 % Crea Apply topically.   tobramycin-dexamethasone ophthalmic solution Commonly known as: TOBRADEX instill 1 drop each eye 1-7 times per week   triamcinolone 0.025 % cream Commonly known as: KENALOG Apply topically.   Vitamin D 50 MCG (2000 UT) Caps Take 1,000 Units by mouth daily. Every other day    Past Medical History:  Diagnosis Date   Anxiety    Asthma    Albuterol inhaler as needed   Bilateral nephrolithiasis    Bilateral ovarian cysts    Chronic back pain    left sided radiculopathy.Scoliosis.Buldging disc.   Chronic kidney disease    Depression    has  Fluoxetine if needed   Endometriosis    GERD (gastroesophageal reflux disease)    takes Omeprazole daily   History of bronchitis    several yrs ago   Hyperlipidemia    takes Pravastatin daily    Hypothyroidism    takes SYnthroid daily   IBS (irritable bowel syndrome)    takes Bentyl daily   Insomnia    Migraine    takes Topamax daily.HA regularly but has been a long time since migraine   Mitral valve disorder    Murmur, cardiac    Muscle spasm    Nocturia    Seasonal allergies    takes Zyrtec and Singulair daily.Also gets allergy  shots   Stroke [facility]) 2009   takes ASA 325 mg daily   Urinary frequency    Vitamin D deficiency     Past Surgical History:  Procedure Laterality Date   COLONOSCOPY     LASIK  2007   LUMBAR LAMINECTOMY     spinal cord decompression   wisdom teeth extracted       Review of systems negative except as noted in HPI / PMHx or noted below:  Review of Systems  Constitutional: Negative.   HENT: Negative.    Eyes: Negative.   Respiratory: Negative.    Cardiovascular: Negative.   Gastrointestinal: Negative.   Genitourinary: Negative.   Musculoskeletal: Negative.   Skin: Negative.   Neurological: Negative.   Endo/Heme/Allergies: Negative.   Psychiatric/Behavioral: Negative.       Objective:   Vitals:   04/28/23 1043  BP: 118/82  Pulse: 78  Resp: 14  SpO2: 99%   Height: 5' 2.4" (158.5 cm)  Weight: 143 lb 9.6 oz (65.1 kg)   Physical Exam Constitutional:      Appearance: She is not diaphoretic.  HENT:     Head: Normocephalic.     Right Ear: Tympanic membrane, ear canal and external ear normal.     Left Ear: Tympanic membrane, ear canal and external ear normal.     Nose: Nose normal. No mucosal edema or rhinorrhea.     Mouth/Throat:     Pharynx: Uvula midline. No oropharyngeal exudate.  Eyes:     Conjunctiva/sclera: Conjunctivae normal.  Neck:     Thyroid: No thyromegaly.     Trachea: Trachea normal. No tracheal tenderness or tracheal deviation.  Cardiovascular:     Rate and Rhythm: Normal rate and regular rhythm.     Heart sounds: Normal heart sounds, S1 normal and S2 normal. No murmur heard. Pulmonary:     Effort: No respiratory distress.     Breath sounds: Normal breath sounds. No stridor. No wheezing or rales.  Lymphadenopathy:     Head:     Right side of head: No tonsillar adenopathy.     Left side of head: No tonsillar adenopathy.     Cervical: No cervical adenopathy.  Skin:    Findings: Rash (periorbital induration erythema) present. No erythema.      Nails: There is no clubbing.  Neurological:     Mental Status: She is alert.     Diagnostics: none  Assessment and Plan:   1. Other atopic dermatitis   2. Perennial allergic rhinitis   3. Seasonal allergic rhinitis due to pollen   4. Perennial allergic conjunctivitis of both eyes   5. Asthma, mild intermittent, well-controlled    1.  Never ever touch / rub eyes  2.  Continue dupilumab injections every 2 weeks  3.  If needed:  A. Opzulera - apply around eyes 1-2 times per day (failed Tacrolimus + steroid)  B. Lotemax - 1 drop each eye 1-7 times per week  C. Systane eye drops multiple times a day  D. AirSupra - 2 inhalations every 4-6 hours   E. Antihsitamine  F.  Pataday  G. Systane  4. Never, ever rub eyes  5. Sports glasses while outdoors spring through fall  6. Further treatment???  Akylah appears to be doing well while using dupilumab regarding her atopic dermatitis and certainly this has also handled her airway disease.  But she does have some blepharoconjunctivitis most likely tied up with her atopic dermatitis that is still an active issue and we will try her on a topical Jak inhibitor around her eyes and a load potency topical steroid for her conjunctiva.  Assuming she does well with this plan I will see her back in this clinic once a year or earlier if there is a problem.  Laurette Schimke, MD Allergy / Immunology [facility]

## 2023-04-28 NOTE — Unmapped (Signed)
Katherine Ball 's DUPIXENT SYRINGE 300 mg/2 mL Syrg injection (dupilumab) shipment will be delayed as a result of prior authorization being required by the patient's insurance.     I have reached out to the patient  at [phone]  and communicated the delay. We will call the patient back to reschedule the delivery upon resolution. We have not confirmed the new delivery date.

## 2023-04-28 NOTE — Patient Instructions (Signed)
  1.  Never ever touch / rub eyes  2.  Continue dupilumab injections every 2 weeks  3.  If needed:   A. Opzulera - apply around eyes 1-2 times per day (failed Tacrolimus + steroid)  B. Lotemax - 1 drop each eye 1-7 times per week  C. Systane eye drops multiple times a day  D. AirSupra - 2 inhalations every 4-6 hours (coupon)  E. Antihsitamine  F.  Pataday  G. Systane  4. Never, ever rub eyes  5. Sports glasses while outdoors spring through fall  6. Further treatment???

## 2023-05-02 ENCOUNTER — Encounter: Payer: Self-pay | Admitting: Allergy and Immunology

## 2023-05-02 NOTE — Unmapped (Signed)
Specialty Medication(s): Dupixent    Katherine Ball has been dis-enrolled from the [employer] [facility] specialty pharmacy services due to a pharmacy change. The patient is now filling at [facility] .    Additional information provided to the patient: Patient can reach [facility] at [phone]. The prescriber has also been made aware.    Sherral Hammers, PharmD  [facility] Specialty and Home Delivery Pharmacy Specialty Pharmacist

## 2023-05-02 NOTE — Unmapped (Signed)
Katherine Ball 's DUPIXENT SYRINGE 300 mg/2 mL Syrg injection (dupilumab) shipment will be canceled as a result of no longer being eligible to fill at [facility] pharmacy.     I have reached out to the patient  via MyChart message and communicated the delay. We will not reschedule the medication and have removed this/these medication(s) from the work request.  We have canceled this work request.

## 2023-05-09 ENCOUNTER — Other Ambulatory Visit (HOSPITAL_COMMUNITY): Payer: Self-pay

## 2023-05-09 ENCOUNTER — Emergency Department
Admit: 2023-05-09 | Discharge: 2023-05-09 | Disposition: A | Discharge status: Home / Self Care | Payer: PRIVATE HEALTH INSURANCE

## 2023-05-09 DIAGNOSIS — R109 Unspecified abdominal pain: Principal | ICD-10-CM

## 2023-05-09 DIAGNOSIS — K5792 Diverticulitis of intestine, part unspecified, without perforation or abscess without bleeding: Principal | ICD-10-CM

## 2023-05-09 MED ORDER — AMOXICILLIN 875 MG-POTASSIUM CLAVULANATE 125 MG TABLET
ORAL_TABLET | Freq: Two times a day (BID) | ORAL | 0 refills | 10.00 days | Status: CP
Start: 2023-05-09 — End: 2023-05-19

## 2024-04-25 ENCOUNTER — Encounter: Payer: Self-pay | Admitting: Allergy and Immunology

## 2024-04-25 ENCOUNTER — Ambulatory Visit: Payer: PRIVATE HEALTH INSURANCE | Admitting: Allergy and Immunology

## 2024-04-25 VITALS — BP 122/86 | HR 88 | Temp 98.1°F | Resp 16 | Ht 62.5 in | Wt 144.4 lb

## 2024-04-25 DIAGNOSIS — J3089 Other allergic rhinitis: Secondary | ICD-10-CM

## 2024-04-25 DIAGNOSIS — J301 Allergic rhinitis due to pollen: Secondary | ICD-10-CM | POA: Diagnosis not present

## 2024-04-25 DIAGNOSIS — H10503 Unspecified blepharoconjunctivitis, bilateral: Secondary | ICD-10-CM | POA: Diagnosis not present

## 2024-04-25 DIAGNOSIS — J4520 Mild intermittent asthma, uncomplicated: Secondary | ICD-10-CM | POA: Diagnosis not present

## 2024-04-25 DIAGNOSIS — L2089 Other atopic dermatitis: Secondary | ICD-10-CM

## 2024-04-25 MED ORDER — FLUOROMETHOLONE 0.1 % OP SUSP: OPHTHALMIC | 11 refills | Status: AC

## 2024-04-25 MED ORDER — AIRSUPRA 90-80 MCG/ACT IN AERO: 2.0000 | INHALATION_SPRAY | RESPIRATORY_TRACT | 1 refills | Status: AC | PRN

## 2024-04-25 NOTE — Patient Instructions (Addendum)
" °  1.  Never ever touch / rub eyes. Sports glasses while outdoors spring through fall  2.  Continue dupilumab injections every 2 weeks  3.  If needed:   A. Opzulera - apply around eyes 1-7 times per week  B. FML - 1 drop each eye 1-7 times per week  C. Pataday  - 1 drop each eye 1 time per day  D. Systane GEL drop at night  E. Systane Ultra drop in morning   F. AirSupra  - 2 inhalations every 4-6 hours   G. Antihsitamine  3. Influenza = tamiflu. Covid = Paxlovid  4. Return to clinic in 1 year or earlier if problem "

## 2024-04-25 NOTE — Progress Notes (Unsigned)
 "  [place] [facility] [place] [facility] [place] [facility] [place] [facility]   Follow-up Note  Referring Provider: Benson Eleanor Rung, NP Primary Provider: Benson Eleanor Rung, NP Date of Office Visit: 04/25/2024  Subjective:   Brenda Bonilla (DOB: 12-Apr-1975) is a 49 y.o. female who [facility] in re-evaluation of the following:  HPI: Brenda Bonilla returns to this clinic in evaluation of atopic dermatitis and blepharoconjunctivitis and history of asthma.  I last saw her in this clinic 28 April 2023.  During her last visit we had her use topical steroid eyedrops, currently FML 1 drop each eye about 2-3 times a week, in conjunction with Pataday  and use some periorbital Opzulera which took care of of her eye issue for the most part and other than having some mucus and some gritty material in her eye when she wakes up she believes that she is doing quite well at this point in time regarding her blepharoconjunctivitis.  And her atopic dermatitis is going very well while using her dupilumab on a consistent basis and she rarely uses any topical agents at this point.  Asthma has once again been invisible and she has no need to use any short acting bronchodilator.  Allergies as of 04/25/2024       Reactions   Gold-containing Drug Products         Medication List    Airsupra  90-80 MCG/ACT Aero Generic drug: Albuterol -Budesonide Inhale 2 puffs into the lungs as needed (every four to six hours for cough, wheeze, shortness of breath. Rinse, gargle, and spit after use.).   aspirin 325 MG tablet Take 325 mg by mouth daily.   cetirizine 10 MG tablet Commonly known as: ZYRTEC Take 10 mg by mouth daily.   clobetasol 0.05 % external solution Commonly known as: TEMOVATE apply to scalp TWICE DAILY for 1-2 weeks AS NEEDED when flaring   dicyclomine  10 MG capsule Commonly known as: BENTYL  Take 1 capsule (10 mg total) by mouth 2 (two) times daily at 8 am  and 10 pm.   Dupixent 300 MG/2ML prefilled syringe Generic drug: dupilumab Inject 300 mg into the skin. What changed: Another medication with the same name was removed. Continue taking this medication, and follow the directions you see here. Changed by: Camellia Denis, MD   Emgality 120 MG/ML Soaj Generic drug: Galcanezumab-gnlm Inject 1 mL into the skin every 30 (thirty) days.   ezetimibe 10 MG tablet Commonly known as: ZETIA Take 10 mg by mouth daily.   fluorometholone  0.1 % ophthalmic suspension Commonly known as: FML Can use one drop in each eye once daily if needed.   ketoconazole 2 % shampoo Commonly known as: NIZORAL apply to scalp EVERY OTHER DAY   levothyroxine  88 MCG tablet Commonly known as: SYNTHROID  Take 88 mcg by mouth daily before breakfast.   losartan 100 MG tablet Commonly known as: COZAAR Take 100 mg by mouth daily.   meloxicam 15 MG tablet Commonly known as: MOBIC Take 15 mg by mouth daily.   montelukast  10 MG tablet Commonly known as: SINGULAIR  Take 10 mg by mouth daily.   nortriptyline 25 MG capsule Commonly known as: PAMELOR Take 25 mg by mouth at bedtime.   omeprazole 20 MG capsule Commonly known as: PRILOSEC Take 20 mg by mouth daily as needed. What changed: when to take this   Opzelura  1.5 % Crea Generic drug: Ruxolitinib Phosphate  Can apply around eyes 1-2 times per day if needed.   pravastatin   40 MG tablet Commonly known as: PRAVACHOL  Take 40 mg by mouth daily.   Roflumilast 0.3 % Crea Apply topically.   traZODone 50 MG tablet Commonly known as: DESYREL Take 50 mg by mouth at bedtime.   triamcinolone 0.025 % cream Commonly known as: KENALOG Apply topically.   ZOLMitriptan 2.5 MG tablet Commonly known as: ZOMIG Take 2.5 mg by mouth daily.    Past Medical History:  Diagnosis Date   Anxiety    Asthma    Albuterol  inhaler as needed   Bilateral nephrolithiasis    Bilateral ovarian cysts    Chronic back pain    left  sided radiculopathy.Scoliosis.Buldging disc.   Chronic kidney disease    Depression    has Fluoxetine if needed   Endometriosis    GERD (gastroesophageal reflux disease)    takes Omeprazole daily   History of bronchitis    several yrs ago   Hyperlipidemia    takes Pravastatin  daily    Hypothyroidism    takes SYnthroid  daily   IBS (irritable bowel syndrome)    takes Bentyl  daily   Insomnia    Migraine    takes Topamax  daily.HA regularly but has been a long time since migraine   Mitral valve disorder    Murmur, cardiac    Muscle spasm    Nocturia    Seasonal allergies    takes Zyrtec and Singulair  daily.Also gets allergy  shots   Stroke [facility]) 2009   takes ASA 325 mg daily   Urinary frequency    Vitamin D  deficiency     Past Surgical History:  Procedure Laterality Date   COLONOSCOPY     LASIK  2007   LUMBAR LAMINECTOMY     spinal cord decompression   wisdom teeth extracted       Review of systems negative except as noted in HPI / PMHx or noted below:  Review of Systems  Constitutional: Negative.   HENT: Negative.    Eyes: Negative.   Respiratory: Negative.    Cardiovascular: Negative.   Gastrointestinal: Negative.   Genitourinary: Negative.   Musculoskeletal: Negative.   Skin: Negative.   Neurological: Negative.   Endo/Heme/Allergies: Negative.   Psychiatric/Behavioral: Negative.       Objective:   Vitals:   04/25/24 1006  BP: 122/86  Pulse: 88  Resp: 16  Temp: 98.1 F (36.7 C)  SpO2: 99%   Height: 5' 2.5 (158.8 cm)  Weight: 144 lb 6.4 oz (65.5 kg)   Physical Exam Constitutional:      Appearance: She is not diaphoretic.  HENT:     Head: Normocephalic.     Right Ear: Tympanic membrane, ear canal and external ear normal.     Left Ear: Tympanic membrane, ear canal and external ear normal.     Nose: Nose normal. No mucosal edema or rhinorrhea.     Mouth/Throat:     Pharynx: Uvula midline. No oropharyngeal exudate.  Eyes:      Conjunctiva/sclera: Conjunctivae normal.  Neck:     Thyroid: No thyromegaly.     Trachea: Trachea normal. No tracheal tenderness or tracheal deviation.  Cardiovascular:     Rate and Rhythm: Normal rate and regular rhythm.     Heart sounds: Normal heart sounds, S1 normal and S2 normal. No murmur heard. Pulmonary:     Effort: No respiratory distress.     Breath sounds: Normal breath sounds. No stridor. No wheezing or rales.  Lymphadenopathy:     Head:     Right  side of head: No tonsillar adenopathy.     Left side of head: No tonsillar adenopathy.     Cervical: No cervical adenopathy.  Skin:    Findings: No erythema or rash.     Nails: There is no clubbing.  Neurological:     Mental Status: She is alert.     Diagnostics: none  Assessment and Plan:   1. Other atopic dermatitis   2. Perennial allergic rhinitis   3. Seasonal allergic rhinitis due to pollen   4. Asthma, mild intermittent, well-controlled   5. Blepharoconjunctivitis of both eyes, unspecified blepharoconjunctivitis type    1.  Never ever touch / rub eyes. Sports glasses while outdoors spring through fall  2.  Continue dupilumab injections every 2 weeks  3.  If needed:   A. Opzulera - apply around eyes 1-7 times per week  B. FML - 1 drop each eye 1-7 times per week  C. Pataday  - 1 drop each eye 1 time per day  D. Systane GEL drop at night  E. Systane Ultra drop in morning   F. AirSupra  - 2 inhalations every 4-6 hours   G. Antihsitamine  3. Influenza = tamiflu. Covid = Paxlovid  4. Return to clinic in 1 year or earlier if problem  Nikala appears to be doing quite well at this point in time while she is utilizing a collection of agents directed against her eye inflammation as well as dupilumab for her atopic dermatitis.  She will continue on the plan noted above and we will see her back in this clinic in 1 year or earlier if there is a problem.  Camellia Denis, MD Allergy  / Immunology [facility] "

## 2024-04-26 ENCOUNTER — Encounter: Payer: Self-pay | Admitting: Allergy and Immunology

## 2025-04-24 ENCOUNTER — Ambulatory Visit: Admitting: Allergy and Immunology
